# Patient Record
Sex: Female | Born: 1970
Health system: Southern US, Community
[De-identification: ages and names within clinical notes are randomized; demographics above are authoritative.]

## PROBLEM LIST (undated history)

## (undated) ENCOUNTER — Emergency Department (HOSPITAL_BASED_OUTPATIENT_CLINIC_OR_DEPARTMENT_OTHER): Admission: EM | Payer: No Typology Code available for payment source | Source: Home / Self Care

## (undated) DIAGNOSIS — Z800 Family history of malignant neoplasm of digestive organs: Secondary | ICD-10-CM

## (undated) DIAGNOSIS — F3289 Other specified depressive episodes: Secondary | ICD-10-CM

## (undated) DIAGNOSIS — F329 Major depressive disorder, single episode, unspecified: Secondary | ICD-10-CM

## (undated) DIAGNOSIS — D509 Iron deficiency anemia, unspecified: Secondary | ICD-10-CM

## (undated) DIAGNOSIS — T7840XA Allergy, unspecified, initial encounter: Secondary | ICD-10-CM

## (undated) DIAGNOSIS — J45909 Unspecified asthma, uncomplicated: Secondary | ICD-10-CM

## (undated) DIAGNOSIS — K219 Gastro-esophageal reflux disease without esophagitis: Secondary | ICD-10-CM

## (undated) DIAGNOSIS — F419 Anxiety disorder, unspecified: Secondary | ICD-10-CM

## (undated) HISTORY — DX: Other specified depressive episodes: F32.89

## (undated) HISTORY — DX: Iron deficiency anemia, unspecified: D50.9

## (undated) HISTORY — DX: Gastro-esophageal reflux disease without esophagitis: K21.9

## (undated) HISTORY — PX: TUBAL LIGATION: SHX77

## (undated) HISTORY — PX: BREAST CYST EXCISION: SHX579

## (undated) HISTORY — DX: Unspecified asthma, uncomplicated: J45.909

## (undated) HISTORY — PX: COLONOSCOPY: SHX174

## (undated) HISTORY — DX: Major depressive disorder, single episode, unspecified: F32.9

## (undated) HISTORY — DX: Allergy, unspecified, initial encounter: T78.40XA

## (undated) HISTORY — PX: OTHER SURGICAL HISTORY: SHX169

## (undated) HISTORY — DX: Family history of malignant neoplasm of digestive organs: Z80.0

---

## 1997-09-29 ENCOUNTER — Emergency Department (HOSPITAL_COMMUNITY): Admission: EM | Admit: 1997-09-29 | Discharge: 1997-09-29 | Payer: Self-pay | Admitting: Emergency Medicine

## 1998-08-16 ENCOUNTER — Emergency Department (HOSPITAL_COMMUNITY): Admission: EM | Admit: 1998-08-16 | Discharge: 1998-08-17 | Payer: Self-pay | Admitting: Emergency Medicine

## 1998-08-19 ENCOUNTER — Emergency Department (HOSPITAL_COMMUNITY): Admission: EM | Admit: 1998-08-19 | Discharge: 1998-08-19 | Payer: Self-pay | Admitting: Emergency Medicine

## 1998-11-22 ENCOUNTER — Emergency Department (HOSPITAL_COMMUNITY): Admission: EM | Admit: 1998-11-22 | Discharge: 1998-11-23 | Payer: Self-pay | Admitting: Emergency Medicine

## 1999-02-05 ENCOUNTER — Encounter: Payer: Self-pay | Admitting: Internal Medicine

## 1999-02-05 ENCOUNTER — Emergency Department (HOSPITAL_COMMUNITY): Admission: EM | Admit: 1999-02-05 | Discharge: 1999-02-05 | Payer: Self-pay | Admitting: Internal Medicine

## 1999-06-07 ENCOUNTER — Emergency Department (HOSPITAL_COMMUNITY): Admission: EM | Admit: 1999-06-07 | Discharge: 1999-06-07 | Payer: Self-pay | Admitting: *Deleted

## 1999-06-08 ENCOUNTER — Encounter: Payer: Self-pay | Admitting: *Deleted

## 1999-08-17 ENCOUNTER — Other Ambulatory Visit: Admission: RE | Admit: 1999-08-17 | Discharge: 1999-08-17 | Payer: Self-pay | Admitting: Obstetrics

## 1999-08-18 ENCOUNTER — Inpatient Hospital Stay (HOSPITAL_COMMUNITY): Admission: AD | Admit: 1999-08-18 | Discharge: 1999-08-18 | Payer: Self-pay | Admitting: Obstetrics

## 1999-10-11 ENCOUNTER — Inpatient Hospital Stay (HOSPITAL_COMMUNITY): Admission: AD | Admit: 1999-10-11 | Discharge: 1999-10-11 | Payer: Self-pay | Admitting: Obstetrics

## 1999-11-19 ENCOUNTER — Inpatient Hospital Stay (HOSPITAL_COMMUNITY): Admission: AD | Admit: 1999-11-19 | Discharge: 1999-11-19 | Payer: Self-pay | Admitting: Obstetrics

## 1999-12-05 ENCOUNTER — Inpatient Hospital Stay (HOSPITAL_COMMUNITY): Admission: AD | Admit: 1999-12-05 | Discharge: 1999-12-05 | Payer: Self-pay | Admitting: Obstetrics

## 2000-01-15 ENCOUNTER — Inpatient Hospital Stay (HOSPITAL_COMMUNITY): Admission: AD | Admit: 2000-01-15 | Discharge: 2000-01-15 | Payer: Self-pay | Admitting: Obstetrics

## 2000-02-17 ENCOUNTER — Inpatient Hospital Stay (HOSPITAL_COMMUNITY): Admission: AD | Admit: 2000-02-17 | Discharge: 2000-02-17 | Payer: Self-pay | Admitting: Obstetrics

## 2000-03-21 ENCOUNTER — Inpatient Hospital Stay (HOSPITAL_COMMUNITY): Admission: AD | Admit: 2000-03-21 | Discharge: 2000-03-23 | Payer: Self-pay | Admitting: Obstetrics

## 2003-08-16 ENCOUNTER — Emergency Department (HOSPITAL_COMMUNITY): Admission: EM | Admit: 2003-08-16 | Discharge: 2003-08-16 | Payer: Self-pay | Admitting: Family Medicine

## 2004-10-11 ENCOUNTER — Encounter: Admission: RE | Admit: 2004-10-11 | Discharge: 2004-10-11 | Payer: Self-pay | Admitting: Obstetrics

## 2004-10-18 ENCOUNTER — Ambulatory Visit (HOSPITAL_COMMUNITY): Admission: RE | Admit: 2004-10-18 | Discharge: 2004-10-18 | Payer: Self-pay | Admitting: Obstetrics

## 2004-10-25 ENCOUNTER — Inpatient Hospital Stay (HOSPITAL_COMMUNITY): Admission: AD | Admit: 2004-10-25 | Discharge: 2004-10-25 | Payer: Self-pay | Admitting: Obstetrics

## 2004-11-08 ENCOUNTER — Inpatient Hospital Stay (HOSPITAL_COMMUNITY): Admission: AD | Admit: 2004-11-08 | Discharge: 2004-11-08 | Payer: Self-pay | Admitting: Obstetrics

## 2005-05-06 ENCOUNTER — Inpatient Hospital Stay (HOSPITAL_COMMUNITY): Admission: AD | Admit: 2005-05-06 | Discharge: 2005-05-06 | Payer: Self-pay | Admitting: Obstetrics

## 2005-05-09 ENCOUNTER — Inpatient Hospital Stay (HOSPITAL_COMMUNITY): Admission: AD | Admit: 2005-05-09 | Discharge: 2005-05-09 | Payer: Self-pay | Admitting: Obstetrics

## 2005-07-05 ENCOUNTER — Emergency Department (HOSPITAL_COMMUNITY): Admission: EM | Admit: 2005-07-05 | Discharge: 2005-07-05 | Payer: Self-pay | Admitting: Emergency Medicine

## 2005-08-03 ENCOUNTER — Ambulatory Visit: Payer: Self-pay | Admitting: Internal Medicine

## 2006-04-30 ENCOUNTER — Emergency Department (HOSPITAL_COMMUNITY): Admission: EM | Admit: 2006-04-30 | Discharge: 2006-04-30 | Payer: Self-pay | Admitting: Family Medicine

## 2006-05-24 ENCOUNTER — Ambulatory Visit: Payer: Self-pay | Admitting: Internal Medicine

## 2007-02-26 ENCOUNTER — Encounter: Payer: Self-pay | Admitting: Internal Medicine

## 2007-02-26 DIAGNOSIS — D649 Anemia, unspecified: Secondary | ICD-10-CM

## 2007-07-28 ENCOUNTER — Ambulatory Visit: Payer: Self-pay | Admitting: Internal Medicine

## 2007-07-28 DIAGNOSIS — H6690 Otitis media, unspecified, unspecified ear: Secondary | ICD-10-CM | POA: Insufficient documentation

## 2007-07-28 DIAGNOSIS — F411 Generalized anxiety disorder: Secondary | ICD-10-CM

## 2007-07-28 DIAGNOSIS — D509 Iron deficiency anemia, unspecified: Secondary | ICD-10-CM

## 2007-11-05 ENCOUNTER — Telehealth: Payer: Self-pay | Admitting: Internal Medicine

## 2007-11-14 ENCOUNTER — Ambulatory Visit: Payer: Self-pay | Admitting: Internal Medicine

## 2008-02-04 ENCOUNTER — Ambulatory Visit: Payer: Self-pay | Admitting: Internal Medicine

## 2008-02-04 LAB — CONVERTED CEMR LAB
ALT: 21 units/L (ref 0–35)
Alkaline Phosphatase: 43 units/L (ref 39–117)
Basophils Absolute: 0 10*3/uL (ref 0.0–0.1)
Basophils Relative: 1 % (ref 0.0–3.0)
CO2: 27 meq/L (ref 19–32)
Calcium: 9.2 mg/dL (ref 8.4–10.5)
Creatinine, Ser: 0.8 mg/dL (ref 0.4–1.2)
Eosinophils Relative: 2.5 % (ref 0.0–5.0)
GFR calc Af Amer: 103 mL/min
GFR calc non Af Amer: 85 mL/min
HCT: 37.2 % (ref 36.0–46.0)
HDL: 75.8 mg/dL (ref >39.0)
LDL Cholesterol: 60 mg/dL (ref 0–99)
Lymphocytes Relative: 43.4 % (ref 12.0–46.0)
MCHC: 33.8 g/dL (ref 30.0–36.0)
Monocytes Relative: 5.8 % (ref 3.0–12.0)
Mucus, UA: NEGATIVE
Neutro Abs: 2 10*3/uL (ref 1.4–7.7)
Neutrophils Relative %: 47.3 % (ref 43.0–77.0)
Potassium: 3.7 meq/L (ref 3.5–5.1)
RBC: 4.18 M/uL (ref 3.87–5.11)
RDW: 12.8 % (ref 11.5–14.6)
Sodium: 139 meq/L (ref 135–145)
Specific Gravity, Urine: 1.02 (ref 1.000–1.03)
TSH: 1.08 microintl units/mL (ref 0.35–5.50)
VLDL: 14 mg/dL (ref 0–40)
pH: 6.5 (ref 5.0–8.0)

## 2008-02-05 ENCOUNTER — Telehealth: Payer: Self-pay | Admitting: Internal Medicine

## 2008-02-17 ENCOUNTER — Ambulatory Visit: Payer: Self-pay | Admitting: Internal Medicine

## 2008-02-17 DIAGNOSIS — F32A Depression, unspecified: Secondary | ICD-10-CM | POA: Insufficient documentation

## 2008-02-17 DIAGNOSIS — F329 Major depressive disorder, single episode, unspecified: Secondary | ICD-10-CM

## 2008-02-17 DIAGNOSIS — R634 Abnormal weight loss: Secondary | ICD-10-CM | POA: Insufficient documentation

## 2008-02-19 ENCOUNTER — Ambulatory Visit: Payer: Self-pay | Admitting: Gastroenterology

## 2008-02-20 ENCOUNTER — Telehealth: Payer: Self-pay | Admitting: Internal Medicine

## 2008-02-25 ENCOUNTER — Ambulatory Visit: Payer: Self-pay | Admitting: Gastroenterology

## 2008-02-26 ENCOUNTER — Telehealth: Payer: Self-pay | Admitting: Gastroenterology

## 2008-02-29 DIAGNOSIS — R1013 Epigastric pain: Secondary | ICD-10-CM

## 2008-03-01 ENCOUNTER — Telehealth (INDEPENDENT_AMBULATORY_CARE_PROVIDER_SITE_OTHER): Payer: Self-pay | Admitting: *Deleted

## 2008-03-01 ENCOUNTER — Ambulatory Visit: Payer: Self-pay | Admitting: Gastroenterology

## 2008-03-02 ENCOUNTER — Telehealth: Payer: Self-pay | Admitting: Gastroenterology

## 2008-03-02 LAB — CONVERTED CEMR LAB
ALT: 23 units/L (ref 0–35)
AST: 21 units/L (ref 0–37)
Albumin: 3.9 g/dL (ref 3.5–5.2)
Alkaline Phosphatase: 38 units/L — ABNORMAL LOW (ref 39–117)
Basophils Relative: 0.5 % (ref 0.0–3.0)
CO2: 28 meq/L (ref 19–32)
Chloride: 107 meq/L (ref 96–112)
Eosinophils Relative: 2 % (ref 0.0–5.0)
GFR calc non Af Amer: 118 mL/min
Hemoglobin: 11.6 g/dL — ABNORMAL LOW (ref 12.0–15.0)
Lymphocytes Relative: 41.3 % (ref 12.0–46.0)
MCHC: 34.2 g/dL (ref 30.0–36.0)
MCV: 86.3 fL (ref 78.0–100.0)
Platelets: 281 10*3/uL (ref 150–400)
Potassium: 3.4 meq/L — ABNORMAL LOW (ref 3.5–5.1)

## 2008-07-23 ENCOUNTER — Telehealth: Payer: Self-pay | Admitting: Internal Medicine

## 2008-08-31 ENCOUNTER — Telehealth (INDEPENDENT_AMBULATORY_CARE_PROVIDER_SITE_OTHER): Payer: Self-pay | Admitting: *Deleted

## 2008-10-04 ENCOUNTER — Ambulatory Visit: Payer: Self-pay | Admitting: Internal Medicine

## 2008-10-04 DIAGNOSIS — R05 Cough: Secondary | ICD-10-CM

## 2008-10-07 ENCOUNTER — Telehealth: Payer: Self-pay | Admitting: Internal Medicine

## 2008-10-25 ENCOUNTER — Telehealth: Payer: Self-pay | Admitting: Internal Medicine

## 2009-02-17 ENCOUNTER — Encounter: Payer: Self-pay | Admitting: Internal Medicine

## 2009-02-18 ENCOUNTER — Ambulatory Visit: Payer: Self-pay | Admitting: Internal Medicine

## 2009-02-18 DIAGNOSIS — M79609 Pain in unspecified limb: Secondary | ICD-10-CM

## 2009-02-18 DIAGNOSIS — R109 Unspecified abdominal pain: Secondary | ICD-10-CM | POA: Insufficient documentation

## 2009-02-18 DIAGNOSIS — N63 Unspecified lump in unspecified breast: Secondary | ICD-10-CM

## 2009-02-18 DIAGNOSIS — R079 Chest pain, unspecified: Secondary | ICD-10-CM | POA: Insufficient documentation

## 2009-02-18 LAB — CONVERTED CEMR LAB
Albumin: 4.3 g/dL (ref 3.5–5.2)
Alkaline Phosphatase: 43 units/L (ref 39–117)
Basophils Relative: 0.9 % (ref 0.0–3.0)
CO2: 29 meq/L (ref 19–32)
Calcium: 9.4 mg/dL (ref 8.4–10.5)
Eosinophils Absolute: 0.1 10*3/uL (ref 0.0–0.7)
Eosinophils Relative: 2.4 % (ref 0.0–5.0)
GFR calc non Af Amer: 119.67 mL/min (ref >60)
Glucose, Bld: 87 mg/dL (ref 70–99)
Hemoglobin: 12.8 g/dL (ref 12.0–15.0)
Lipase: 12 units/L (ref 11.0–59.0)
Lymphs Abs: 2 10*3/uL (ref 0.7–4.0)
MCV: 88 fL (ref 78.0–100.0)
Monocytes Absolute: 0.3 10*3/uL (ref 0.1–1.0)
Neutrophils Relative %: 47 % (ref 43.0–77.0)
Potassium: 4.1 meq/L (ref 3.5–5.1)
Sodium: 137 meq/L (ref 135–145)
Total Protein, Urine: NEGATIVE mg/dL
Urobilinogen, UA: 0.2 (ref 0.0–1.0)
pH: 7 (ref 5.0–8.0)

## 2009-02-21 ENCOUNTER — Telehealth: Payer: Self-pay | Admitting: Internal Medicine

## 2009-03-03 ENCOUNTER — Encounter: Admission: RE | Admit: 2009-03-03 | Discharge: 2009-03-03 | Payer: Self-pay | Admitting: Internal Medicine

## 2009-05-24 ENCOUNTER — Telehealth: Payer: Self-pay | Admitting: Internal Medicine

## 2009-07-04 LAB — HM MAMMOGRAPHY

## 2009-08-09 ENCOUNTER — Ambulatory Visit: Payer: Self-pay | Admitting: Internal Medicine

## 2009-09-06 ENCOUNTER — Encounter: Payer: Self-pay | Admitting: Emergency Medicine

## 2009-09-07 ENCOUNTER — Ambulatory Visit: Payer: Self-pay | Admitting: Internal Medicine

## 2009-09-07 ENCOUNTER — Encounter: Payer: Self-pay | Admitting: Internal Medicine

## 2009-09-07 DIAGNOSIS — M5412 Radiculopathy, cervical region: Secondary | ICD-10-CM | POA: Insufficient documentation

## 2009-09-08 ENCOUNTER — Telehealth: Payer: Self-pay | Admitting: Internal Medicine

## 2009-09-09 ENCOUNTER — Ambulatory Visit (HOSPITAL_COMMUNITY): Admission: RE | Admit: 2009-09-09 | Discharge: 2009-09-09 | Payer: Self-pay | Admitting: Internal Medicine

## 2009-10-06 ENCOUNTER — Ambulatory Visit: Payer: Self-pay | Admitting: Psychology

## 2009-11-14 ENCOUNTER — Telehealth: Payer: Self-pay | Admitting: Internal Medicine

## 2010-01-26 ENCOUNTER — Ambulatory Visit: Payer: Self-pay | Admitting: Internal Medicine

## 2010-01-26 DIAGNOSIS — J019 Acute sinusitis, unspecified: Secondary | ICD-10-CM

## 2010-01-26 DIAGNOSIS — R062 Wheezing: Secondary | ICD-10-CM

## 2010-03-01 ENCOUNTER — Encounter: Payer: Self-pay | Admitting: Internal Medicine

## 2010-03-20 ENCOUNTER — Ambulatory Visit: Payer: Self-pay | Admitting: Internal Medicine

## 2010-03-20 ENCOUNTER — Telehealth: Payer: Self-pay | Admitting: Internal Medicine

## 2010-03-21 ENCOUNTER — Encounter: Admission: RE | Admit: 2010-03-21 | Discharge: 2010-03-21 | Payer: Self-pay | Admitting: Obstetrics

## 2010-03-21 ENCOUNTER — Ambulatory Visit (HOSPITAL_COMMUNITY): Admission: RE | Admit: 2010-03-21 | Discharge: 2010-03-21 | Payer: Self-pay | Admitting: Obstetrics

## 2010-03-22 LAB — CONVERTED CEMR LAB
Basophils Relative: 0.9 % (ref 0.0–3.0)
Folate: 10.1 ng/mL
HCT: 34.6 % — ABNORMAL LOW (ref 36.0–46.0)
Iron: 58 ug/dL (ref 42–145)
Lymphs Abs: 3 10*3/uL (ref 0.7–4.0)
MCHC: 34.1 g/dL (ref 30.0–36.0)
Neutrophils Relative %: 39.3 % — ABNORMAL LOW (ref 43.0–77.0)
RDW: 14.6 % (ref 11.5–14.6)
Saturation Ratios: 11.8 % — ABNORMAL LOW (ref 20.0–50.0)

## 2010-03-29 ENCOUNTER — Ambulatory Visit: Payer: Self-pay | Admitting: Internal Medicine

## 2010-03-29 ENCOUNTER — Encounter: Payer: Self-pay | Admitting: Internal Medicine

## 2010-03-29 DIAGNOSIS — R040 Epistaxis: Secondary | ICD-10-CM | POA: Insufficient documentation

## 2010-03-29 DIAGNOSIS — L049 Acute lymphadenitis, unspecified: Secondary | ICD-10-CM | POA: Insufficient documentation

## 2010-03-29 LAB — CONVERTED CEMR LAB
Basophils Absolute: 0 10*3/uL (ref 0.0–0.1)
Eosinophils Relative: 2.4 % (ref 0.0–5.0)
HCT: 33.8 % — ABNORMAL LOW (ref 36.0–46.0)
Hemoglobin: 11.2 g/dL — ABNORMAL LOW (ref 12.0–15.0)
INR: 1.1 — ABNORMAL HIGH (ref 0.8–1.0)
Lymphs Abs: 1.5 10*3/uL (ref 0.7–4.0)
MCV: 83.1 fL (ref 78.0–100.0)
Monocytes Absolute: 0.3 10*3/uL (ref 0.1–1.0)
Neutro Abs: 1.4 10*3/uL (ref 1.4–7.7)
Neutrophils Relative %: 40.8 % — ABNORMAL LOW (ref 43.0–77.0)
WBC: 3.3 10*3/uL — ABNORMAL LOW (ref 4.5–10.5)

## 2010-04-17 ENCOUNTER — Ambulatory Visit (HOSPITAL_BASED_OUTPATIENT_CLINIC_OR_DEPARTMENT_OTHER): Admission: RE | Admit: 2010-04-17 | Discharge: 2010-04-17 | Payer: Self-pay | Admitting: Surgery

## 2010-05-26 ENCOUNTER — Encounter: Payer: Self-pay | Admitting: Internal Medicine

## 2010-06-11 ENCOUNTER — Encounter: Payer: Self-pay | Admitting: Internal Medicine

## 2010-06-20 NOTE — Assessment & Plan Note (Signed)
Summary: SORE THROAT/ CHEST HURTS/ SICK SINCE MONDAY/NWS   Vital Signs:  Patient profile:   40 year old female Height:      64 inches Weight:      106.50 pounds BMI:     18.35 O2 Sat:      99 % on Room air Temp:     98.4 degrees F oral Pulse rate:   84 / minute BP sitting:   100 / 78  (left arm) Cuff size:   regular  Vitals Entered By: Zella Ball Ewing CMA (AAMA) (January 26, 2010 4:05 PM)  O2 Flow:  Room air CC: Sore throat, congestion in chest and head/RE   Primary Care Provider:  Corwin Levins MD  CC:  Sore throat and congestion in chest and head/RE.  History of Present Illness: here with acute onset mild to mod 2-3 days facial pain, pressure, fever and greenish d/c, mild ST and non prod cough, headache  and midl wheezing/sob since last PM.  Hard to take deep breath , and was exposed to sick neice recently.  Also denies worsening depressive symptoms, has ongoing stressors at work and social,  denies panic, Pt denies new neuro symptoms such as facial or extremity weakness . No wt loss, night sweats, loss of appetite or other constitutional symptoms   Problems Prior to Update: 1)  Wheezing  (ICD-786.07) 2)  Sinusitis- Acute-nos  (ICD-461.9) 3)  Arm Pain, Right  (ICD-729.5) 4)  Cervical Radiculopathy, Right  (ICD-723.4) 5)  Abdominal Pain, Unspecified Site  (ICD-789.00) 6)  Breast Mass, Left  (ICD-611.72) 7)  Chest Pain  (ICD-786.50) 8)  Arm Pain, Right  (ICD-729.5) 9)  Cough  (ICD-786.2) 10)  Epigastric Pain  (ICD-789.06) 11)  Weight Loss  (ICD-783.21) 12)  Depression  (ICD-311) 13)  Preventive Health Care  (ICD-V70.0) 14)  Family History of Colon Ca 1st Degree Relative <60  (ICD-V16.0) 15)  Anxiety  (ICD-300.00) 16)  Otitis Media, Acute, Bilateral  (ICD-382.9) 17)  Anemia-iron Deficiency  (ICD-280.9) 18)  Anemia Nos  (ICD-285.9)  Medications Prior to Update: 1)  Alprazolam 0.25 Mg  Tabs (Alprazolam) .Marland Kitchen.. 1 By Mouth Two Times A Day As Needed 2)  Meloxicam 7.5 Mg Tabs  (Meloxicam) .Marland Kitchen.. 1 By Mouth Once Daily X 7 Days, Then As Needed For Pain 3)  Flexeril 5 Mg Tabs (Cyclobenzaprine Hcl) .Marland Kitchen.. 1 By Mouth Three Times A Day As Needed 4)  Oxycodone Hcl 5 Mg Caps (Oxycodone Hcl) .Marland Kitchen.. 1 - 2 By Mouth Q 6 Hrs As Needed 5)  Prednisone 10 Mg Tabs (Prednisone) .... 3po Qd For 3days, Then 2po Qd For 3days, Then 1po Qd For 3days, Then Stop  Current Medications (verified): 1)  Alprazolam 0.25 Mg  Tabs (Alprazolam) .Marland Kitchen.. 1 By Mouth Two Times A Day As Needed 2)  Prednisone 10 Mg Tabs (Prednisone) .... 3po Qd For 3days, Then 2po Qd For 3days, Then 1po Qd For 3days, Then Stop 3)  Azithromycin 250 Mg Tabs (Azithromycin) .... 2po Qd For 1 Day, Then 1po Qd For 4days, Then Stop 4)  Hydrocodone-Homatropine 5-1.5 Mg Tabs (Hydrocodone-Homatropine) .Marland Kitchen.. 1 Tsp By Mouth Q 6 Hrs As Needed Cough  Allergies (verified): 1)  ! * Avelox 2)  ! 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  , mild ill  Head:  normocephalic and atraumatic.   Eyes:  vision grossly intact, pupils equal, and pupils round.   Ears:  bilat tm's red, sinus tender bilat Nose:  nasal dischargemucosal pallor and mucosal edema.   Mouth:  pharyngeal erythema and fair dentition.   Neck:  supple and cervical lymphadenopathy.   Lungs:  normal respiratory effort, R decreased breath sounds, R wheezes, L decreased breath sounds, and L wheezes.   - overall mild Heart:  normal rate and regular  rhythm.   Extremities:  no edema, no erythema    Impression & Recommendations:  Problem # 1:  SINUSITIS- ACUTE-NOS (ICD-461.9)  Her updated medication list for this problem includes:    Azithromycin 250 Mg Tabs (Azithromycin) .Marland Kitchen... 2po qd for 1 day, then 1po qd for 4days, then stop    Hydrocodone-homatropine 5-1.5 Mg Tabs (Hydrocodone-homatropine) .Marland Kitchen... 1 tsp by mouth q 6 hrs as needed cough treat as above, f/u any worsening signs or symptoms   Problem # 2:  WHEEZING (ICD-786.07) with cough - for cough med, and pred pack by mouth   Problem # 3:  ANXIETY (ICD-300.00)  Her updated medication list for this problem includes:    Alprazolam 0.25 Mg Tabs (Alprazolam) .Marland Kitchen... 1 by mouth two times a day as needed stable overall by hx and exam, ok to continue meds/tx as is   Complete Medication List: 1)  Alprazolam 0.25 Mg Tabs (Alprazolam) .Marland Kitchen.. 1 by mouth two times a day as needed 2)  Prednisone 10 Mg Tabs (Prednisone) .... 3po qd for 3days, then 2po qd for 3days, then 1po qd for 3days, then stop 3)  Azithromycin 250 Mg Tabs (Azithromycin) .... 2po qd for 1 day, then 1po qd for 4days, then stop 4)  Hydrocodone-homatropine 5-1.5 Mg Tabs (Hydrocodone-homatropine) .Marland Kitchen.. 1 tsp by mouth q 6 hrs as needed cough  Patient Instructions: 1)  Please take all new medications as prescribed 2)  Continue all previous medications as before this visit  3)  Please schedule a follow-up appointment as needed. Prescriptions: HYDROCODONE-HOMATROPINE 5-1.5 MG TABS (HYDROCODONE-HOMATROPINE) 1 tsp by mouth q 6 hrs as needed cough  #6oz x 1   Entered and Authorized by:   Corwin Levins MD   Signed by:   Corwin Levins MD on 01/26/2010   Method used:   Print then Give to Patient   RxID:   [id number] AZITHROMYCIN 250 MG TABS (AZITHROMYCIN) 2po qd for 1 day, then 1po qd for 4days, then stop  #6 x 1   Entered and Authorized by:   Corwin Levins MD   Signed by:   Corwin Levins MD on 01/26/2010   Method used:   Print  then Give to Patient   RxID:   [id number] PREDNISONE 10 MG TABS (PREDNISONE) 3po qd for 3days, then 2po qd for 3days, then 1po qd for 3days, then stop  #18 x 0   Entered and Authorized by:   Corwin Levins MD   Signed by:   Corwin Levins MD on 01/26/2010   Method used:   Print then Give to Patient   RxID:   [id number]

## 2010-06-20 NOTE — Assessment & Plan Note (Signed)
Summary: CHEST PRESSURE SINCE "SUNDAY/ DULL ACHE ON RIGHT SIDE/ CAN'T S...   Vital Signs:  Patient profile:   40 year old female Height:      64 inches (162.56 cm) Weight:      109.2 pounds (49.64 kg) O2 Sat:      98 % on Room air Temp:     97" .7 degrees F (36.50 degrees C) oral Pulse rate:   82 / minute BP sitting:   126 / 82  (left arm) Cuff size:   regular  Vitals Entered By: Orlan Leavens (August 09, 2009 4:07 PM)  O2 Flow:  Room air CC: chest discomfort since Sat. Pt states above (R) side breast area. (R) arm tingley/numbness Is Patient Diabetic? No Pain Assessment Patient in pain? yes     Location: chest Type: dull/sharp sometime   Primary Care Provider:  Corwin Levins MD  CC:  chest discomfort since Sat. Pt states above (R) side breast area. (R) arm tingley/numbness.  History of Present Illness:  Oct 2010 note by PCP reviewed - here with intermittent right side dll aching of the chest , more mild to mod, some aching to the right neck and arm with pain and tingling, as well as pain to the right mid and upper back - all at the same time as the right chest pain; also with  with headache off an on for 2 day;  no RUE weakness or lloss of grip strength;  no fever, ST, cough, sob, doe, orhtopnea, pnd or worsening LE edema;  Also had mid to upper abd pain, crampy and bloating, hurts more to eat when she finishes (although sometimes before), no n/v, also with intermittent diarrhea  for 2 day - loose and watery  - bad for 2 days, now better; feels warm at night, not sure about fever, having hot flashes recurrent for months;  due to see GYN (will make appt) for ? lump to the left chest  under the breast;  wt overall stable up and down; has ongoing marked stress at work and home as well        This is a 39 year old female who presents with Chest pain.  The symptoms began 4 days ago; hx same 02/2009 ago.  On a scale of 1 to 10, the intensity is described as a 3-4.  no known CAD.  The patient  reports resting chest pain, but denies nausea, vomiting, diaphoresis, shortness of breath, palpitations, dizziness, syncope, and indigestion.  The pain is described as intermittent and dull.  The pain is located in the right anterior chest, neck, and right upper chest.  The pain radiates to the right arm.  Episodes of chest pain last >30 minutes.  The pain is brought on or made worse by lying down.  The pain is relieved or improved with NSAIDs, especially Aleve, and change in position.  Continued + family stress with sister's illness and sister's 3 children.  Clinical Review Panels:  Lipid Management   Cholesterol:  150 (02/04/2008)   LDL (bad choesterol):  60 (02/04/2008)   HDL (good cholesterol):  75.8 (02/04/2008)  CBC   WBC:  4.7 (02/18/2009)   RBC:  4.28 (02/18/2009)   Hgb:  12.8 (02/18/2009)   Hct:  37.7 (02/18/2009)   Platelets:  278.0 (02/18/2009)   MCV  88.0 (02/18/2009)   MCHC  33.9 (02/18/2009)   RDW  12.9 (02/18/2009)   PMN:  47.0 (02/18/2009)   Lymphs:  43.2 (02/18/2009)   Monos:  6.5 (02/18/2009)   Eosinophils:  2.4 (02/18/2009)   Basophil:  0.9 (02/18/2009)  Complete Metabolic Panel   Glucose:  87 (02/18/2009)   Sodium:  137 (02/18/2009)   Potassium:  4.1 (02/18/2009)   Chloride:  104 (02/18/2009)   CO2:  29 (02/18/2009)   BUN:  11 (02/18/2009)   Creatinine:  0.7 (02/18/2009)   Albumin:  4.3 (02/18/2009)   Total Protein:  7.8 (02/18/2009)   Calcium:  9.4 (02/18/2009)   Total Bili:  1.5 (02/18/2009)   Alk Phos:  43 (02/18/2009)   SGPT (ALT):  15 (02/18/2009)   SGOT (AST):  18 (02/18/2009)   Current Medications (verified): 1)  Alprazolam 0.25 Mg  Tabs (Alprazolam) .Marland Kitchen.. 1 By Mouth Two Times A Day As Needed  Allergies (verified): 1)  ! * Avelox 2)  ! Asa  Past History:  Past Medical History: Anemia-iron deficiency chronic insomnia Anxiety Depression  Review of Systems  The patient denies fever, hoarseness, peripheral edema, headaches, abdominal  pain, and severe indigestion/heartburn.    Physical Exam  General:  alert and well-developed but lean appearing Lungs:  normal respiratory effort, no intercostal retractions or use of accessory muscles; normal breath sounds bilaterally - no crackles and no wheezes.    Heart:  normal rate, regular rhythm, no murmur, and no rub. BLE without edema.  Neurologic:  alert & oriented X3 and cranial nerves II-XII symetrically intact.  strength normal in all extremities, sensation intact to light touch, and gait normal. speech fluent without dysarthria or aphasia; follows commands with good comprehension. negative Hoffman's   Impression & Recommendations:  Problem # 1:  CHEST PAIN (ICD-786.50) EKG ok - ?radicular from neck - see next - as improved transiently with aleve, try longer acting Mobic for pain - also muscle relaxant to help with sleep at night - reassurance provided re: normal EKG and VS and exam Orders: EKG w/ Interpretation (93000) T-Cervicle Spine 2-3 Views ([id number]) Prescription Created Electronically [id number])  Problem # 2:  ARM PAIN, RIGHT (ICD-729.5) hx same 02/2009 - improved with position and NSAIDs - worse at night check cervical xray to look for DDD - ?need MRI c-spine as discussed in Oct 2010 - no motor deficits on exam - depending on xray and response to tx, consider need for MRI Orders: T-Cervicle Spine 2-3 Views [id number]) Prescription Created Electronically [id number])  Problem # 3:  ANXIETY (ICD-300.00)  Her updated medication list for this problem includes:    Alprazolam 0.25 Mg Tabs (Alprazolam) .Marland Kitchen... 1 by mouth two times a day as needed  Complete Medication List: 1)  Alprazolam 0.25 Mg Tabs (Alprazolam) .Marland Kitchen.. 1 by mouth two times a day as needed 2)  Meloxicam 7.5 Mg Tabs (Meloxicam) .Marland Kitchen.. 1 by mouth once daily x 7 days, then as needed for pain 3)  Flexeril 5 Mg Tabs (Cyclobenzaprine hcl) .Marland Kitchen.. 1 by mouth at bedtime as needed for spasm pain  Patient  Instructions: 1)  it was good to see you today. 2)  your EKG and exam look good - no heart or lung problems detected 3)  xray today to look for neck arthritis - we will notify you directly about these results 4)  treat pain with 7 day trial meloxicam for pain and muscle relaxant for spasm at bedtime - your prescriptions have been electronically submitted to your pharmacy. Please take as directed. Contact our office if you believe you're having problems with the medication(s).  5)  Please schedule a follow-up appointment as needed. Prescriptions: FLEXERIL 5  MG TABS (CYCLOBENZAPRINE HCL) 1 by mouth at bedtime as needed for spasm pain  #30 x 0   Entered and Authorized by:   Newt Lukes MD   Signed by:   Newt Lukes MD on 08/09/2009   Method used:   Electronically to        [facility] [address] [phone]* (retail)       [address]       [place], [place]  [address]       Ph: [phone] or [phone]       Fax: [phone]   RxID:   [id number] MELOXICAM 7.5 MG TABS (MELOXICAM) 1 by mouth once daily x 7 days, then as needed for pain  #30 x 0   Entered and Authorized by:   Newt Lukes MD   Signed by:   Newt Lukes MD on 08/09/2009   Method used:   [facility]        [facility] [address] [phone]* (retail)       [address]       [place], [place]  [address]       Ph: [phone] or [phone]       Fax: [phone]   RxID:   [id number]

## 2010-06-20 NOTE — Progress Notes (Signed)
Summary: pt request  Phone Note Call from Patient   Caller: [phone] Summary of Call: pt is requesting something to help with her nreves. He sister is dying of cancer and pt is very anxious. pt [facility] requestinf [facility] Initial call taken by: Margaret Pyle, CMA,  May 24, 2009 11:09 AM  Follow-up for Phone Call        done hardcopy to [facility] side B - dahlia  Follow-up by: Corwin Levins MD,  May 24, 2009 1:08 PM  Additional Follow-up for Phone Call Additional follow up Details #1::        faxed to pharmacy per pt request Additional Follow-up by: Margaret Pyle, CMA,  May 24, 2009 1:46 PM    Prescriptions: ALPRAZOLAM 0.25 MG  TABS (ALPRAZOLAM) 1 by mouth two times a day as needed  #60 x 2   Entered and Authorized by:   Corwin Levins MD   Signed by:   Corwin Levins MD on 05/24/2009   Method used:   Print then Give to Patient   RxID:   [id number]

## 2010-06-20 NOTE — Progress Notes (Signed)
Summary: Call Report  Phone Note Other Incoming   Caller: [facility] Call Report Summary of Call: Triage Call Report Triage Record Num: [id number] Operator: Aundra Millet Patient Name: Ashley Christensen Call Date & Time: 09/07/2009 11:15:03PM Patient Phone: [phone] PCP: Oliver Barre Patient Gender: Female PCP Fax : [phone] Patient DOB: 11/29/1971 Practice Name: [facility] Reason for Call: Pt had OV with Dr Jonny Ruiz for arm pain that resulted from a fall 09/06/2009. Pt had negative xray. Pt was prescribed Prednisone, Flexeril and Oxycodone. Pt took Oxycodone at 2230, and then 30 mins ago vomited 4-5 times and feeling dizzy to the point cant stand and feels like she is going to pass out. Also having shaking. A friend is with her. RN advised to dial 911 and she agreed. Protocol(s) Used: Nausea / Vomiting Recommended Outcome per Protocol: Activate EMS 911 Reason for Outcome: New or worsening signs and symptoms that may indicate shock Care Advice:  ~ Do not give the patient anything to eat or drink. Write down provider's name. List or place the following in a bag for transport with the patient: current prescription and/or OTC medications; alternative treatments, therapies and medications; and street drugs.  ~  ~ An adult should stay with the patient, preferably one trained in CPR.  ~ If vomiting occurs or appears likely, turn on side to prevent aspiration. Lay the person down and elevate legs at least 12 inches (30 cm) above level of heart. Cover to help maintain body temperature.  ~  ~ IMMEDIATE ACTION 04/ Initial call taken by: Margaret Pyle, CMA,  September 08, 2009 11:59 AM  Follow-up for Phone Call        stop the oxycodone;  have pt call if any further symtpoms Follow-up by: Corwin Levins MD,  September 08, 2009 12:34 PM  Additional Follow-up for Phone Call Additional follow up Details #1::        pt informed Additional Follow-up by: Margaret Pyle, CMA,  September 08, 2009 1:15 PM

## 2010-06-20 NOTE — Assessment & Plan Note (Signed)
Summary: ER FU / FELL/ INJURED ARM/NWS   Vital Signs:  Patient profile:   40 year old female Height:      64 inches Weight:      109.25 pounds BMI:     18.82 O2 Sat:      98 % on Room air Temp:     97.7 degrees F oral Pulse rate:   78 / minute BP sitting:   112 / 80  (left arm) Cuff size:   regular  Vitals Entered ByZella Ball Ewing (September 07, 2009 2:11 PM)  O2 Flow:  Room air CC: Fell hurt Right Arm, swollen, numbness/RE   Primary Care Provider:  Corwin Levins MD  CC:  Larey Seat hurt Right Arm, swollen, and numbness/RE.  History of Present Illness: here for f/u after being seen in the [facility] ER yesterday (for some reason , we cannot obtain ANY records such as MD documentation, or radiolog reports on the [place]).  States she has baseline right neck pain with ? right radicular symptoms intermittent but mild pain only and was getting better.  She fell the last 3 stairs of four flights as she was descending to answer the buzzer to open the front door of the secure building (turned out to be a nephew just having fun with buzzing and then not anwering).  States majority of pain is right neck, upper back and right elbow area, though the whole right neck and shoulder and arm hurt quite a bit.  Was 10/10 last evening and tx iwth limited narcotic rx and naproxen per pt (neither of which she had filled yet).  Pain to the right elbow still 8/10, with marked soft tissue swelling and tenderness, but actually also worse to the right neck with radiculalr pain it seems to below the elbow and assoc with numbness and weakness that seemed to start this morning. No bowel or bladder changes, and no LE symtpoms of pain, weak, numb, or further falls or injury.  no fever, night sweats; ongoing anxiety overall stable.   She is right handed.    Problems Prior to Update: 1)  Arm Pain, Right  (ICD-729.5) 2)  Cervical Radiculopathy, Right  (ICD-723.4) 3)  Abdominal Pain, Unspecified Site  (ICD-789.00) 4)  Breast Mass, Left   (ICD-611.72) 5)  Chest Pain  (ICD-786.50) 6)  Arm Pain, Right  (ICD-729.5) 7)  Cough  (ICD-786.2) 8)  Epigastric Pain  (ICD-789.06) 9)  Weight Loss  (ICD-783.21) 10)  Depression  (ICD-311) 11)  Preventive Health Care  (ICD-V70.0) 12)  Family History of Colon Ca 1st Degree Relative <60  (ICD-V16.0) 13)  Anxiety  (ICD-300.00) 14)  Otitis Media, Acute, Bilateral  (ICD-382.9) 15)  Anemia-iron Deficiency  (ICD-280.9) 16)  Anemia Nos  (ICD-285.9)  Medications Prior to Update: 1)  Alprazolam 0.25 Mg  Tabs (Alprazolam) .Marland Kitchen.. 1 By Mouth Two Times A Day As Needed 2)  Meloxicam 7.5 Mg Tabs (Meloxicam) .Marland Kitchen.. 1 By Mouth Once Daily X 7 Days, Then As Needed For Pain 3)  Flexeril 5 Mg Tabs (Cyclobenzaprine Hcl) .Marland Kitchen.. 1 By Mouth At Bedtime As Needed For Spasm Pain  Current Medications (verified): 1)  Alprazolam 0.25 Mg  Tabs (Alprazolam) .Marland Kitchen.. 1 By Mouth Two Times A Day As Needed 2)  Meloxicam 7.5 Mg Tabs (Meloxicam) .Marland Kitchen.. 1 By Mouth Once Daily X 7 Days, Then As Needed For Pain 3)  Flexeril 5 Mg Tabs (Cyclobenzaprine Hcl) .Marland Kitchen.. 1 By Mouth Three Times A Day As Needed 4)  Oxycodone Hcl 5  Mg Caps (Oxycodone Hcl) .Marland Kitchen.. 1 - 2 By Mouth Q 6 Hrs As Needed 5)  Prednisone 10 Mg Tabs (Prednisone) .... 3po Qd For 3days, Then 2po Qd For 3days, Then 1po Qd For 3days, Then Stop  Allergies (verified): 1)  ! * Avelox 2)  ! 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   Head:  normocephalic and atraumatic.   Eyes:  vision grossly intact, pupils equal, and pupils round.   Ears:  R ear normal and L ear normal.   Nose:  no external deformity and no nasal discharge.   Mouth:  no gingival abnormalities and pharynx pink and moist.   Neck:  supple and no masses.   Lungs:  normal respiratory effort and normal breath sounds.   Heart:  normal rate and regular rhythm.   Msk:  spine nontender througout except lower levels mild c-spine tender;  has moderate tender to right trapezoid and  paracervical area  - wtih pain on turning head right horizontal plane with slight decreased ROM;; right elbow with severe soft tissue swelling at the lateral epidondylar area without bruising or obvious bony abnormality, and has FROM Extremities:  no edema, no erythema  Neurologic:  alert & oriented X3, cranial nerves II-XII intact, strength normal in all extremities, sensation intact to light touch, and DTRs symmetrical and normal., except for decreased sensation to LT to c4 and c5 dermatomes, and possible decreased right grip strength alhtough difficult to assess due to pain Skin:  color normal and no rashes.   Psych:  slightly anxious.     Impression & Recommendations:  Problem # 1:  CERVICAL RADICULOPATHY, RIGHT (ICD-723.4)  vs severe strain of RUE and shoulder after fall - seems to have abnormal exam as above -   treat as above, (toradol 30 mg in the office, oxycodone as needed for home, flexeril as needed, and predpack course);  also for MRI c-spine, consider ortho/NS referral;  also off work note given  Orders: Radiology Referral (Radiology)  Problem # 2:  ARM PAIN, RIGHT (ICD-729.5)  severe MSK contusion noted on exam, cant r/o underlying  fx,  and NO ER records available on echart (MD record or Radiolog report);  will check right elbow film due to marked local swelling  Orders: Ketorolac-Toradol 15mg  (G9562) Admin of Therapeutic Inj  intramuscular or subcutaneous (13086)   addendum:   we did find xray report under pt 's misspelled name:  Ashley Christensen - right elbow, arm and wrist neg for fx  - will hold on doing the film today  Problem # 3:  ANXIETY (ICD-300.00)  Her updated medication list for this problem includes:    Alprazolam 0.25 Mg Tabs (Alprazolam) .Marland Kitchen... 1 by mouth two times a day as needed  stable overall by hx and exam, ok to continue meds/tx as is   Complete Medication List: 1)  Alprazolam 0.25 Mg Tabs (Alprazolam) .Marland Kitchen.. 1 by mouth two times a day as needed 2)  Meloxicam 7.5 Mg Tabs (Meloxicam) .Marland Kitchen.. 1 by mouth once daily x 7 days, then as needed for pain 3)  Flexeril 5 Mg Tabs (Cyclobenzaprine hcl) .Marland Kitchen.. 1 by mouth three times a day as needed 4)  Oxycodone Hcl 5 Mg Caps (Oxycodone hcl) .Marland Kitchen.. 1 - 2 by mouth q 6 hrs as needed 5)  Prednisone 10 Mg Tabs (Prednisone) .... 3po qd for 3days, then 2po qd for 3days, then 1po qd for 3days, then stop  Patient Instructions: 1)  You were given the pain shot in the office (toradol) 2)  Please take all new medications as prescribed  - the pain medicine, muscle relaxer, and prednisone 3)  please do not take the meloxicam on the days you take the prednisone 4)  Please go to Radiology in the basement level for your X-Ray today  5)  You will be contacted about the referral(s) to: MRI for the neck (and depending on results, you may need Neurosurgury evaluation) 6)  You are given the work note today 7)  Please schedule a follow-up appointment as needed. Prescriptions: PREDNISONE 10 MG TABS (PREDNISONE) 3po qd for 3days, then 2po qd for 3days, then 1po qd for 3days, then stop  #18 x 0   Entered and Authorized by:   Corwin Levins MD   Signed by:   Corwin Levins MD on 09/07/2009    Method used:   Print then Give to Patient   RxID:   [id number] OXYCODONE HCL 5 MG CAPS (OXYCODONE HCL) 1 - 2 by mouth q 6 hrs as needed  #60 x 0   Entered and Authorized by:   Corwin Levins MD   Signed by:   Corwin Levins MD on 09/07/2009   Method used:   Print then Give to Patient   RxID:   [id number] FLEXERIL 5 MG TABS (CYCLOBENZAPRINE HCL) 1 by mouth three times a day as needed  #60 x 1   Entered and Authorized by:   Corwin Levins MD   Signed by:   Corwin Levins MD on 09/07/2009   Method used:   Print then Give to Patient   RxID:   [id number]    Medication Administration  Injection # 1:    Medication: Ketorolac-Toradol 15mg     Diagnosis: ARM PAIN, RIGHT (ICD-729.5)    Route: IM    Site: RUOQ gluteus    Exp Date: 12/20/2010    Lot #: [id number]    Mfr: Nova Plus    Given by: Zella Ball Ewing (September 07, 2009 2:44 PM)  Orders Added: 1)  Ketorolac-Toradol 15mg  [J1885] 2)  Admin of Therapeutic Inj  intramuscular or subcutaneous [96372] 3)  Radiology Referral [Radiology] 4)  Est. Patient Level IV [36644]

## 2010-06-20 NOTE — Letter (Signed)
Summary: Out of Work  [facility]  [address] [place], [place] [address]   Phone: [phone]  Fax: [phone]    September 07, 2009   Employee:  Ashley Christensen    To Whom It May Concern:   For Medical reasons, please excuse the above named employee from work for the following dates:  Start:   Rest of the afternoon today , Sep 07, 2009  End:   Sep 11, 2009   -    to return to work Sep 12, 2009  If you need additional information, please feel free to contact our office.         Sincerely,    Corwin Levins MD

## 2010-06-20 NOTE — Consult Note (Signed)
Summary: [facility]  [facility]   Imported By: Sherian Rein 04/03/2010 07:44:28  _____________________________________________________________________  External Attachment:    Type:   Image     Comment:   External Document

## 2010-06-20 NOTE — Assessment & Plan Note (Signed)
Summary: cough--stc   Vital Signs:  Patient profile:   40 year old female Height:      64 inches Weight:      105.50 pounds BMI:     18.17 O2 Sat:      99 % on Room air Temp:     97.6 degrees F oral Pulse rate:   97 / minute BP sitting:   128 / 84  (left arm) Cuff size:   regular  Vitals Entered By: Zella Ball Ewing CMA (AAMA) (March 29, 2010 8:02 AM)  O2 Flow:  Room air CC: Cough, nose bleed, chest congestion/RE   Primary Care Provider:  Corwin Levins MD  CC:  Cough, nose bleed, and chest congestion/RE.  History of Present Illness: here for exam acute - pt c/o doing well yesterday in her usual state of health, when last evening developed left epistaxis with significant blood and mild discomfort left nasal area;  no trauma, fever, other bleeding or bruising, or hx of same in the past.  No recent obvious sinus or URI symtpoms except she recalls her GYN did tell with a recent exam she did have some cervical LA. (which apparently persists today).  Her first instinct was to put her head back but became dizzy, so then put her head between the knees.  Did not pinch the nose it seems and bleeding was significant, lasting 30 min.  Since that time seems to have some oozing with post nasal poolling with some cough overnight, more signifant cough and even some midl wheezing this am, but Pt denies CP, worsening sob, doe, orthopnea, pnd, worsening LE edema, palps, dizziness or syncope  No fever, wt loss, night sweats, loss of appetite or other constitutional symptoms  Pt denies new neuro symptoms such as headache, facial or extremity weakness .  Mild nervous only today - Denies worsening depressive symptoms, suicidal ideation, or panic.   No hx of nasal allergies; though did have episode of CAP last yr tx here with antibx.  No known immunodeficiency or malignancy or bleeding d/o  Incidently also menitons recent w/u for left chest mass with diag mammogram/ultrasound has shown prob benign but large mass  such as fibroadenoma;  has appt for eval per chest surgury nov 16, with option for biopsy/surgury vs q 6 mo diagnostic mammograms.    Problems Prior to Update: 1)  Wheezing  (ICD-786.07) 2)  Sinusitis- Acute-nos  (ICD-461.9) 3)  Arm Pain, Right  (ICD-729.5) 4)  Cervical Radiculopathy, Right  (ICD-723.4) 5)  Abdominal Pain, Unspecified Site  (ICD-789.00) 6)  Breast Mass, Left  (ICD-611.72) 7)  Chest Pain  (ICD-786.50) 8)  Arm Pain, Right  (ICD-729.5) 9)  Cough  (ICD-786.2) 10)  Epigastric Pain  (ICD-789.06) 11)  Weight Loss  (ICD-783.21) 12)  Depression  (ICD-311) 13)  Preventive Health Care  (ICD-V70.0) 14)  Family History of Colon Ca 1st Degree Relative <60  (ICD-V16.0) 15)  Anxiety  (ICD-300.00) 16)  Otitis Media, Acute, Bilateral  (ICD-382.9) 17)  Anemia-iron Deficiency  (ICD-280.9) 18)  Anemia Nos  (ICD-285.9)  Medications Prior to Update: 1)  Alprazolam 0.25 Mg  Tabs (Alprazolam) .Marland Kitchen.. 1 By Mouth Two Times A Day As Needed 2)  Prednisone 10 Mg Tabs (Prednisone) .... 3po Qd For 3days, Then 2po Qd For 3days, Then 1po Qd For 3days, Then Stop 3)  Azithromycin 250 Mg Tabs (Azithromycin) .... 2po Qd For 1 Day, Then 1po Qd For 4days, Then Stop 4)  Hydrocodone-Homatropine 5-1.5 Mg Tabs (Hydrocodone-Homatropine) .Marland KitchenMarland KitchenMarland Kitchen  1 Tsp By Mouth Q 6 Hrs As Needed Cough  Current Medications (verified): 1)  Alprazolam 0.25 Mg  Tabs (Alprazolam) .Marland Kitchen.. 1 By Mouth Two Times A Day As Needed  Allergies (verified): 1)  ! * Avelox 2)  ! 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 but on lean side Head:  normocephalic and atraumatic.   Eyes:  vision grossly intact, pupils equal, and pupils round.   Ears:  R ear normal.  , left tm mil erythema, sinus nontender, nares without frank blood loss at this time Nose:  no external deformity and no nasal discharge.   Mouth:  no gingival abnormalities and pharynx pink and moist.  , noted some slight right tonsillar hypertrophy Neck:  supple and cervical lymphadenopathy, tender, mobile , more right than left, more pre and post SCM bilat but few at the angle of jaw on left as well; none large or concerning Lungs:  normal respiratory effort and normal breath sounds.   Heart:  normal rate and regular rhythm.   Extremities:  no edema, no erythema    Impression & Recommendations:  Problem # 1:  NOSEBLEED (ICD-784.7)  left side , unclear etiology - more than minor per pt and seems to have had oozing overnight at well with persistent blood with cough - to ENT today if possible  Orders: TLB-CBC Platelet - w/Differential (85025-CBCD) TLB-PT (Protime) (85610-PTP) ENT Referral (ENT)  Problem # 2:  CERVICAL LYMPHADENITIS (ICD-683)  The following medications were removed from the medication list:    Azithromycin 250 Mg Tabs (Azithromycin) .Marland Kitchen... 2po qd for 1 day, then 1po qd for 4days, then stop small, tender, mobile, ? recent URI - exam o/w beningn except for left TM mild erythema - ok to follow for now, afeb;  to check wbc with above  Problem # 3:  WHEEZING (ICD-786.07)  exam benign, for cxr but suspect related to bronchial congestion with some clearing  by the time of exam this am  Orders: T-2 View CXR, Same Day (71020.5TC)  Complete Medication List: 1)  Alprazolam 0.25 Mg Tabs (Alprazolam) .Marland Kitchen.. 1 by mouth two times a day as needed  Patient Instructions: 1)  Continue all previous medications as before this visit 2)   Please go to the Lab in the basement for your blood and/or urine tests today  3)  Please go to Radiology in the basement level for your X-Ray today  4)  Please call the number on the [facility] for results of your testing 5)  You will be contacted about the referral(s) to: ENT 6)  Please schedule a follow-up appointment as needed.   Orders Added: 1)  T-2 View CXR, Same Day [71020.5TC] 2)  TLB-CBC Platelet - w/Differential [85025-CBCD] 3)  TLB-PT (Protime) [85610-PTP] 4)  ENT Referral [ENT] 5)  Est. Patient Level IV [09811]

## 2010-06-20 NOTE — Progress Notes (Signed)
Summary: Rx refill req  Phone Note Call from Patient   Caller: Patient [phone] Summary of Call: Pt called requesting refill of her Alprazolam. Her sister has recently died and she is unable to sleep and is nervous. Pt is requesting Rx be called into [facility]. Initial call taken by: Margaret Pyle, CMA,  November 14, 2009 1:54 PM    New/Updated Medications: ALPRAZOLAM 0.25 MG  TABS (ALPRAZOLAM) 1 by mouth two times a day as needed Prescriptions: ALPRAZOLAM 0.25 MG  TABS (ALPRAZOLAM) 1 by mouth two times a day as needed  #60 x 1   Entered and Authorized by:   Corwin Levins MD   Signed by:   Corwin Levins MD on 11/14/2009   Method used:   Print then Give to Patient   RxID:   [id number]  done hardcopy to LIM side B - dahlia  Corwin Levins MD  November 14, 2009 2:18 PM   Rx called into pharmacy per pt request Margaret Pyle, CMA  November 14, 2009 2:32 PM

## 2010-06-20 NOTE — Progress Notes (Signed)
Summary: Lab  Phone Note Call from Patient   Caller: Patient Summary of Call: Pt received results of labs done at employee health and had some abnormal levels. Pt requested MD review and after his review additional labs were ordered. Pt informed. Initial call taken by: Margaret Pyle, CMA,  March 20, 2010 1:26 PM

## 2010-06-22 NOTE — Letter (Signed)
Summary: [facility]   Imported By: Lennie Odor 06/14/2010 14:43:45  _____________________________________________________________________  External Attachment:    Type:   Image     Comment:   External Document

## 2010-08-01 LAB — POCT HEMOGLOBIN-HEMACUE: Hemoglobin: 12.2 g/dL (ref 12.0–15.0)

## 2010-10-06 NOTE — Op Note (Signed)
[name], [name]:  [id number]   MEDICAL RECORD NO.:  [id number]          PATIENT TYPE:  AMB   LOCATION:  SDC                           FACILITY:  [facility]   PHYSICIAN:  Kathreen Cosier, M.D.DATE OF BIRTH:  Jan 25, 1971   DATE OF PROCEDURE:  10/18/2004  DATE OF DISCHARGE:                                 OPERATIVE REPORT   PREOPERATIVE DIAGNOSES:  Multiparity.   POSTOPERATIVE DIAGNOSES:  Multiparity.   PROCEDURE:  Open laparoscopic tubal sterilization.   DESCRIPTION OF PROCEDURE:  Under general anesthesia, the patient in  lithotomy position, abdomen, perineum and vagina, prepped and draped,  bladder emptied with a straight catheter. With the speculum placed in the  vagina, the anterior lip of the cervix grasped with a hulka tenaculum. In  the umbilicus, a transverse incision made, carried down to the fascia, the  fascia wiped clean, grasped with two Kocher's. The fascia and peritoneum  opened with the Mayo scissors. The sleeve of the trocar inserted  intraperitoneally, 3 liters carbon dioxide infused intraperitoneally.  __________ scope inserted through the sleeve with the scope. The uterus,  tubes and ovaries normal. The right tube was grasped 1 inch from the  __________ and cauterized. The tube was cauterized in a total of four places  moving lateral from the first site of cautery. The procedure was done in the  exact fashion on the other side. The camera did not work. Probes removed,  CO2 allowed to escape from the peritoneal cavity. Fascia closed with one  stitch of #0 Dexon and skin closed with subcuticular stitch of 4-0 Monocryl.      BAM/MEDQ  D:  10/18/2004  T:  10/18/2004  Job:  [id number]

## 2011-04-23 ENCOUNTER — Other Ambulatory Visit: Payer: Self-pay | Admitting: Obstetrics

## 2011-04-24 ENCOUNTER — Encounter (HOSPITAL_COMMUNITY): Payer: Self-pay | Admitting: *Deleted

## 2011-05-01 ENCOUNTER — Encounter (HOSPITAL_COMMUNITY): Payer: Self-pay

## 2011-05-09 ENCOUNTER — Ambulatory Visit (HOSPITAL_COMMUNITY): Admission: RE | Admit: 2011-05-09 | Payer: 59 | Source: Ambulatory Visit | Admitting: Obstetrics

## 2011-05-09 ENCOUNTER — Encounter (HOSPITAL_COMMUNITY): Admission: RE | Payer: Self-pay | Source: Ambulatory Visit

## 2011-05-09 HISTORY — DX: Anxiety disorder, unspecified: F41.9

## 2011-05-09 SURGERY — DILATATION AND CURETTAGE /HYSTEROSCOPY
Anesthesia: Choice

## 2011-08-26 ENCOUNTER — Encounter (HOSPITAL_COMMUNITY): Payer: Self-pay | Admitting: *Deleted

## 2011-08-26 ENCOUNTER — Inpatient Hospital Stay (HOSPITAL_COMMUNITY)
Admission: AD | Admit: 2011-08-26 | Discharge: 2011-08-26 | Disposition: A | Discharge status: Home / Self Care | Payer: 59 | Source: Ambulatory Visit | Attending: Obstetrics | Admitting: Obstetrics

## 2011-08-26 DIAGNOSIS — A5901 Trichomonal vulvovaginitis: Secondary | ICD-10-CM | POA: Insufficient documentation

## 2011-08-26 DIAGNOSIS — A599 Trichomoniasis, unspecified: Secondary | ICD-10-CM

## 2011-08-26 DIAGNOSIS — N920 Excessive and frequent menstruation with regular cycle: Secondary | ICD-10-CM

## 2011-08-26 LAB — URINALYSIS, ROUTINE W REFLEX MICROSCOPIC
Bilirubin Urine: NEGATIVE
Ketones, ur: NEGATIVE mg/dL
Specific Gravity, Urine: >1.03 — ABNORMAL HIGH (ref 1.005–1.030)

## 2011-08-26 LAB — URINE MICROSCOPIC-ADD ON

## 2011-08-26 LAB — CBC
HCT: 34.1 % — ABNORMAL LOW (ref 36.0–46.0)
MCV: 79.9 fL (ref 78.0–100.0)
RBC: 4.27 MIL/uL (ref 3.87–5.11)
WBC: 4.2 10*3/uL (ref 4.0–10.5)

## 2011-08-26 MED ORDER — METRONIDAZOLE 500 MG PO TABS: 2000.0000 mg | ORAL_TABLET | Freq: Once | ORAL | Status: AC

## 2011-08-26 MED ORDER — MEDROXYPROGESTERONE ACETATE 10 MG PO TABS: 10.0000 mg | ORAL_TABLET | Freq: Every day | ORAL | Status: DC

## 2011-08-26 NOTE — Discharge Instructions (Signed)
Abnormal Uterine Bleeding Abnormal uterine bleeding can have many causes. Some cases are simply treated, while others are more serious. There are several kinds of bleeding that is considered abnormal, including:  Bleeding between periods.   Bleeding after sexual intercourse.   Spotting anytime in the menstrual cycle.   Bleeding heavier or more than normal.   Bleeding after menopause.  CAUSES  There are many causes of abnormal uterine bleeding. It can be present in teenagers, pregnant women, women during their reproductive years, and women who have reached menopause. Your caregiver will look for the more common causes depending on your age, signs, symptoms and your particular circumstance. Most cases are not serious and can be treated. Even the more serious causes, like cancer of the female organs, can be treated adequately if found in the early stages. That is why all types of bleeding should be evaluated and treated as soon as possible. DIAGNOSIS  Diagnosing the cause may take several kinds of tests. Your caregiver may:  Take a complete history of the type of bleeding.   Perform a complete physical exam and Pap smear.   Take an ultrasound on the abdomen showing a picture of the female organs and the pelvis.   Inject dye into the uterus and Fallopian tubes and X-ray them (hysterosalpingogram).   Place fluid in the uterus and do an ultrasound (sonohysterogrqphy).   Take a CT scan to examine the female organs and pelvis.   Take an MRI to examine the female organs and pelvis. There is no X-ray involved with this procedure.   Look inside the uterus with a telescope that has a light at the end (hysteroscopy).   Scrap the inside of the uterus to get tissue to examine (Dilatation and Curettage, D&C).   Look into the pelvis with a telescope that has a light at the end (laparoscopy). This is done through a very small cut (incision) in the abdomen.  TREATMENT  Treatment will depend on the  cause of the abnormal bleeding. It can include:  Doing nothing to allow the problem to take care of itself over time.   Hormone treatment.   Birth control pills.   Treating the medical condition causing the problem.   Laparoscopy.   Major or minor surgery   Destroying the lining of the uterus with electrical currant, laser, freezing or heat (uterine ablation).  HOME CARE INSTRUCTIONS   Follow your caregiver's recommendation on how to treat your problem.   See your caregiver if you missed a menstrual period and think you may be pregnant.   If you are bleeding heavily, count the number of pads/tampons you use and how often you have to change them. Tell this to your caregiver.   Avoid sexual intercourse until the problem is controlled.  SEEK MEDICAL CARE IF:   You have any kind of abnormal bleeding mentioned above.   You feel dizzy at times.   You are 41 years old and have not had a menstrual period yet.  SEEK IMMEDIATE MEDICAL CARE IF:   You pass out.   You are changing pads/tampons every 15 to 30 minutes.   You have belly (abdominal) pain.   You have a temperature of 100 F (37.8 C) or higher.   You become sweaty or weak.   You are passing large blood clots from the vagina.   You start to feel sick to your stomach (nauseous) and throw up (vomit).  Document Released: 05/07/2005 Document Revised: 04/26/2011 Document Reviewed: 09/30/2008 ExitCare   Patient Information 2012 ExitCare, LLCTrichomoniasis Trichomoniasis is an infection, caused by the Trichomonas organism, that affects both women and men. In women, the outer female genitalia and the vagina are affected. In men, the penis is mainly affected, but the prostate and other reproductive organs can also be involved. Trichomoniasis is a sexually transmitted disease (STD) and is most often passed to another person through sexual contact. The majority of people who get trichomoniasis do so from a sexual encounter and are  also at risk for other STDs. CAUSES   Sexual intercourse with an infected partner.   It can be present in swimming pools or hot tubs.  SYMPTOMS   Abnormal gray-green frothy vaginal discharge in women.   Vaginal itching and irritation in women.   Itching and irritation of the area outside the vagina in women.   Penile discharge with or without pain in males.   Inflammation of the urethra (urethritis), causing painful urination.   Bleeding after sexual intercourse.  RELATED COMPLICATIONS  Pelvic inflammatory disease.   Infection of the uterus (endometritis).   Infertility.   Tubal (ectopic) pregnancy.   It can be associated with other STDs, including gonorrhea and chlamydia, hepatitis B, and HIV.  COMPLICATIONS DURING PREGNANCY  Early (premature) delivery.   Premature rupture of the membranes (PROM).   Low birth weight.  DIAGNOSIS   Visualization of Trichomonas under the microscope from the vagina discharge.   Ph of the vagina greater than 4.5, tested with a test tape.   Trich Rapid Test.   Culture of the organism, but this is not usually needed.   It may be found on a Pap test.   Having a "strawberry cervix,"which means the cervix looks very red like a strawberry.  TREATMENT   You may be given medication to fight the infection. Inform your caregiver if you could be or are pregnant. Some medications used to treat the infection should not be taken during pregnancy.   Over-the-counter medications or creams to decrease itching or irritation may be recommended.   Your sexual partner will need to be treated if infected.  HOME CARE INSTRUCTIONS   Take all medication prescribed by your caregiver.   Take over-the-counter medication for itching or irritation as directed by your caregiver.   Do not have sexual intercourse while you have the infection.   Do not douche or wear tampons.   Discuss your infection with your partner, as your partner may have acquired  the infection from you. Or, your partner may have been the person who transmitted the infection to you.   Have your sex partner examined and treated if necessary.   Practice safe, informed, and protected sex.   See your caregiver for other STD testing.  SEEK MEDICAL CARE IF:   You still have symptoms after you finish the medication.   You have an oral temperature above 102 F (38.9 C).   You develop belly (abdominal) pain.   You have pain when you urinate.   You have bleeding after sexual intercourse.   You develop a rash.   The medication makes you sick or makes you throw up (vomit).  Document Released: 10/31/2000 Document Revised: 04/26/2011 Document Reviewed: 11/26/2008 ExitCare Patient Information 2012 ExitCare, LLC.. 

## 2011-08-26 NOTE — MAU Note (Signed)
LMP 3/25, has continued to bleed ever since, passing some clots, pain on L side.  Dx'd with trichomonas 3 months ago, was treated, not sure if partner was treated.  Having some of same sx's again.  Feels lightheaded & dizzy.

## 2011-08-26 NOTE — MAU Provider Note (Signed)
History     CSN: [id number]  Arrival date and time: 08/26/11 1102   First Provider Initiated Contact with Patient 08/26/11 1149      Chief Complaint  Patient presents with  . Vaginal Bleeding  . Abdominal Pain   HPI This is a 41 y.o. female who presents with c/o heavy menses for 3 weeks. Also thinks she may have been reexposed to Trich.  She was treated before but thinks her partner never got treated. States has had the heavy bleeding/prolonged periods before. Dr Gaynell Face had told her he wanted to do a D&C, but she never went back to schedule that. Feels weak and thinks last Hgb was "7 something".  OB History    Grav Para Term Preterm Abortions TAB SAB Ect Mult Living   2 2 2       2       Past Medical History  Diagnosis Date  . Anxiety     xanax prn  . Trichomonas     Past Surgical History  Procedure Date  . Svd     x 2  . Tubal ligation   . Left breast     lumpectomy left breast - cyst  . Colonscopy     Family History  Problem Relation Age of Onset  . Cancer Sister     History  Substance Use Topics  . Smoking status: Never Smoker   . Smokeless tobacco: Never Used  . Alcohol Use: No    Allergies:  Allergies  Allergen Reactions  . Latex Hives and Itching  . Aspirin Hives    Pt avoids ibuprofen , usually takes tylenol  . Moxifloxacin     REACTION: n/v    Prescriptions prior to admission  Medication Sig Dispense Refill  . naproxen sodium (ANAPROX) 220 MG tablet Take 220 mg by mouth daily as needed. For headache        Review of Systems  Constitutional: Positive for malaise/fatigue.  Neurological: Positive for weakness.   Vaginal itching and discharge Vaginal bleeding Some cramping No fever or other symptoms  Physical Exam   Blood pressure 126/82, pulse 83, temperature 97.5 F (36.4 C), temperature source Oral, resp. rate 16, last menstrual period 08/13/2011.  Physical Exam  Constitutional: She is oriented to person, place, and time. She  appears well-developed and well-nourished. No distress.  HENT:  Head: Normocephalic.  Cardiovascular: Normal rate.   Respiratory: Effort normal.  GI: Soft. She exhibits no distension and no mass. There is no tenderness. There is no rebound and no guarding.  Genitourinary: Uterus normal. Vaginal discharge (small to moderate blood, cultures and wet prep obtained) found.  Musculoskeletal: Normal range of motion.  Neurological: She is alert and oriented to person, place, and time.  Skin: Skin is warm and dry.  Psychiatric: She has a normal mood and affect.   CBC    Component Value Date/Time   WBC 4.2 08/26/2011 1214   RBC 4.27 08/26/2011 1214   HGB 10.7* 08/26/2011 1214   HCT 34.1* 08/26/2011 1214   PLT 384 08/26/2011 1214   MCV 79.9 08/26/2011 1214   MCH 25.1* 08/26/2011 1214   MCHC 31.4 08/26/2011 1214   RDW 15.1 08/26/2011 1214   LYMPHSABS 1.5 03/29/2010 0846   MONOABS 0.3 03/29/2010 0846   EOSABS 0.1 03/29/2010 0846   BASOSABS 0.0 03/29/2010 0846     MAU Course  Procedures  MDM Discussed with Dr Gaynell Face who wants to do a D&C. Will put her on Provera for now.  Assessment and Plan  A:  Menorrhagia      Trichomonas infection, recurrent  P:  Rx Provera 10mg  qd for 10 days. Warned of withdrawal bleed       Dr Gaynell Face Will talk to her about D&C/intraop biopsy         Call his office to schedule visit        Rx Flagyl 2gm X 1, have partner treated and abstain from [facility]  [facility] 08/26/2011, 12:46 PM

## 2011-08-27 LAB — GC/CHLAMYDIA PROBE AMP, GENITAL: Chlamydia, DNA Probe: NEGATIVE

## 2011-12-11 ENCOUNTER — Other Ambulatory Visit: Payer: Self-pay | Admitting: Internal Medicine

## 2012-01-09 ENCOUNTER — Encounter (HOSPITAL_COMMUNITY): Payer: Self-pay | Admitting: *Deleted

## 2012-01-09 ENCOUNTER — Emergency Department (INDEPENDENT_AMBULATORY_CARE_PROVIDER_SITE_OTHER): Payer: 59

## 2012-01-09 ENCOUNTER — Emergency Department (HOSPITAL_COMMUNITY)
Admission: EM | Admit: 2012-01-09 | Discharge: 2012-01-09 | Disposition: A | Discharge status: Home / Self Care | Payer: 59 | Source: Home / Self Care | Attending: Family Medicine | Admitting: Family Medicine

## 2012-01-09 DIAGNOSIS — J45909 Unspecified asthma, uncomplicated: Secondary | ICD-10-CM

## 2012-01-09 MED ORDER — IPRATROPIUM BROMIDE 0.02 % IN SOLN
0.5000 mg | Freq: Once | RESPIRATORY_TRACT | Status: AC
  Administered 2012-01-09: 0.5 mg via RESPIRATORY_TRACT

## 2012-01-09 MED ORDER — ALBUTEROL SULFATE (5 MG/ML) 0.5% IN NEBU
5.0000 mg | INHALATION_SOLUTION | Freq: Once | RESPIRATORY_TRACT | Status: AC
  Administered 2012-01-09: 5 mg via RESPIRATORY_TRACT

## 2012-01-09 MED ORDER — ALBUTEROL SULFATE (5 MG/ML) 0.5% IN NEBU
INHALATION_SOLUTION | RESPIRATORY_TRACT | Status: AC
  Filled 2012-01-09: qty 1

## 2012-01-09 MED ORDER — FLUTICASONE PROPIONATE HFA 110 MCG/ACT IN AERO: 1.0000 | INHALATION_SPRAY | Freq: Two times a day (BID) | RESPIRATORY_TRACT | Status: DC

## 2012-01-09 MED ORDER — OMEPRAZOLE 20 MG PO CPDR: 20.0000 mg | DELAYED_RELEASE_CAPSULE | Freq: Every day | ORAL | Status: DC

## 2012-01-09 MED ORDER — BENZONATATE 100 MG PO CAPS: 100.0000 mg | ORAL_CAPSULE | Freq: Three times a day (TID) | ORAL | Status: AC

## 2012-01-09 MED ORDER — PREDNISONE 20 MG PO TABS: ORAL_TABLET | ORAL | Status: AC

## 2012-01-09 NOTE — ED Notes (Signed)
Pt  Has  Asthma    She  Reports  Cough  And  Shortness io  Breath      Today  As  Well  As  A  Sensation of  heavyness in  Chest   She  Reports   She  Saw pcp  Last week  And  Was  rx  Prednisone  And  Inhalers      She  driove  Herself to  Clinic    She  Is  Alert and  Oriented  Speaking in  Complete  sentances

## 2012-01-09 NOTE — ED Notes (Signed)
Pt  REQUESTS  RX  AT  [facility]  [facility] [address]         [facility]IE  AT  [facility]  [address]  CALLED  SHE  WILL  SEND  HARD COPIES  TO  [facility]
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
## 2012-01-10 NOTE — ED Notes (Signed)
Pt  Denied  Any  Pain  An  assesment

## 2012-07-15 ENCOUNTER — Encounter: Payer: Self-pay | Admitting: Internal Medicine

## 2012-07-15 ENCOUNTER — Ambulatory Visit (INDEPENDENT_AMBULATORY_CARE_PROVIDER_SITE_OTHER): Payer: 59 | Admitting: Internal Medicine

## 2012-07-15 ENCOUNTER — Ambulatory Visit: Payer: 59

## 2012-07-15 VITALS — BP 112/90 | HR 81 | Temp 98.1°F | Ht 64.0 in | Wt 106.4 lb

## 2012-07-15 DIAGNOSIS — R350 Frequency of micturition: Secondary | ICD-10-CM

## 2012-07-15 DIAGNOSIS — Z0000 Encounter for general adult medical examination without abnormal findings: Secondary | ICD-10-CM

## 2012-07-15 DIAGNOSIS — Z0001 Encounter for general adult medical examination with abnormal findings: Secondary | ICD-10-CM | POA: Insufficient documentation

## 2012-07-15 LAB — POCT URINALYSIS DIPSTICK
Bilirubin, UA: NEGATIVE
Glucose, UA: NEGATIVE
Ketones, UA: NEGATIVE
Spec Grav, UA: 1.01

## 2012-07-15 MED ORDER — CEPHALEXIN 500 MG PO CAPS: 500.0000 mg | ORAL_CAPSULE | Freq: Four times a day (QID) | ORAL | Status: DC

## 2012-07-15 NOTE — Patient Instructions (Addendum)
Please take all new medication as prescribed Please continue all other medications as before, and refills have been done if requested. Please go to the LAB in the Basement (turn left off the elevator) for the tests to be done today You will be contacted by phone if any changes need to be made immediately.  Otherwise, you will receive a letter about your results with an explanation, but please check with MyChart first. Thank you for enrolling in MyChart. Please follow the instructions below to securely access your online medical record. MyChart allows you to send messages to your doctor, view your test results, renew your prescriptions, schedule appointments, and more. To Log into My Chart online, please go by [employer] or [employer] to [email or web address], or download the MyChart App from the [employer] of [employer] .  Your Username is: [phone] (pass [email or web address]) Please send a practice Message on Mychart later today.

## 2012-07-15 NOTE — Progress Notes (Signed)
Subjective:    Patient ID: Ashley Christensen, female    DOB: [birth year], 42 y.o.   MRN: [id number]  HPI  Here to f/u, c/o 2-3 days mild to mod urinary freq but Denies urinary symptoms such as dysuria, urgency, flank pain, hematuria or n/v, fever, chills.  Feels poorly though.  Also mentions concern of excessive thirst for over a wk for unclear reasons, drinks more than "4 cups" per day when has been less in the past, and has not been more active or sweating recently, no hx of OAB, no hx of DM, and no labs on record per emr for over 3 yrs.  Has hx of anemia improved to normal 2012, no overt bleeding or bruising.  Denies worsening depressive symptoms, suicidal ideation, or panic Past Medical History  Diagnosis Date  . Anxiety     xanax prn  . Trichomonas   . ANEMIA-IRON DEFICIENCY 07/28/2007    Qualifier: Diagnosis of  By: Jonny Ruiz MD, Len Blalock   . ANXIETY 07/28/2007    Qualifier: Diagnosis of  By: Jonny Ruiz MD, Len Blalock   . DEPRESSION 02/17/2008    Qualifier: Diagnosis of  By: Jonny Ruiz MD, Len Blalock    Past Surgical History  Procedure Laterality Date  . Svd      x 2  . Tubal ligation    . Left breast      lumpectomy left breast - cyst  . Colonscopy      reports that she has never smoked. She has never used smokeless tobacco. She reports that she does not drink alcohol or use illicit drugs. family history includes Cancer in her sister. Allergies  Allergen Reactions  . Latex Hives and Itching  . Aspirin Hives    Pt avoids ibuprofen , usually takes tylenol  . Moxifloxacin     REACTION: n/v   Current Outpatient Prescriptions on File Prior to Visit  Medication Sig Dispense Refill  . omeprazole (PRILOSEC) 20 MG capsule Take 1 capsule (20 mg total) by mouth daily.  30 capsule  0  . ALBUTEROL SULFATE HFA IN Inhale into the lungs.      . fluticasone (FLOVENT HFA) 110 MCG/ACT inhaler Inhale 1 puff into the lungs 2 (two) times daily.  1 Inhaler  12  . medroxyPROGESTERone (PROVERA) 10 MG tablet Take 1 tablet  (10 mg total) by mouth daily.  10 tablet  0  . [DISCONTINUED] Fluticasone-Salmeterol (ADVAIR DISKUS IN) Inhale into the lungs.       No current facility-administered medications on file prior to visit.    Review of Systems  Constitutional: Negative for unexpected weight change, or unusual diaphoresis  HENT: Negative for tinnitus.   Eyes: Negative for photophobia and visual disturbance.  Respiratory: Negative for choking and stridor.   Gastrointestinal: Negative for vomiting and blood in stool.  Genitourinary: Negative for hematuria and decreased urine volume.  Musculoskeletal: Negative for acute joint swelling Skin: Negative for color change and wound.  Neurological: Negative for tremors and numbness other than noted  Psychiatric/Behavioral: Negative for decreased concentration or  hyperactivity.       Objective:   Physical Exam BP 112/90  Pulse 81  Temp(Src) 98.1 F (36.7 C) (Oral)  Ht 5\' 4"  (1.626 m)  Wt 106 lb 6 oz (48.251 kg)  BMI 18.25 kg/m2  SpO2 99% VS noted, fatigued, mild nervous appaering Constitutional: Pt appears well-developed and well-nourished.  HENT: Head: NCAT.  Right Ear: External ear normal.  Left Ear: External ear normal.  Eyes: Conjunctivae  and EOM are normal. Pupils are equal, round, and reactive to light.  Neck: Normal range of motion. Neck supple.  Cardiovascular: Normal rate and regular rhythm.   Pulmonary/Chest: Effort normal and breath sounds normal.  Abd:  Soft, non-distended, + BS, no flank tender, mild low abd tender, no guarding Neurological: Pt is alert. Not confused  Skin: Skin is warm. No erythema.  Psychiatric: Pt behavior is normal.    Assessment & Plan:

## 2012-07-15 NOTE — Assessment & Plan Note (Signed)
Suspect prob cystitis, UA dip c/w pt, Mild to mod, for antibx course,  to f/u any worsening symptoms or concerns, for urine studies, also for cbc, cmet, tsh given her concern of increased thirst as well - r/o dm and other

## 2012-07-16 ENCOUNTER — Other Ambulatory Visit (INDEPENDENT_AMBULATORY_CARE_PROVIDER_SITE_OTHER): Payer: 59

## 2012-07-16 DIAGNOSIS — R350 Frequency of micturition: Secondary | ICD-10-CM

## 2012-07-16 LAB — BASIC METABOLIC PANEL
BUN: 14 mg/dL (ref 6–23)
Chloride: 102 mEq/L (ref 96–112)
Potassium: 3.6 mEq/L (ref 3.5–5.1)

## 2012-07-16 LAB — CBC WITH DIFFERENTIAL/PLATELET
Basophils Relative: 0.3 % (ref 0.0–3.0)
Eosinophils Relative: 1.9 % (ref 0.0–5.0)
HCT: 34 % — ABNORMAL LOW (ref 36.0–46.0)
Hemoglobin: 11 g/dL — ABNORMAL LOW (ref 12.0–15.0)
Lymphs Abs: 2.2 10*3/uL (ref 0.7–4.0)
Monocytes Relative: 5.9 % (ref 3.0–12.0)
Neutro Abs: 2.2 10*3/uL (ref 1.4–7.7)
RDW: 14.9 % — ABNORMAL HIGH (ref 11.5–14.6)

## 2012-07-16 LAB — HEPATIC FUNCTION PANEL
ALT: 20 U/L (ref 0–35)
AST: 23 U/L (ref 0–37)
Albumin: 4.3 g/dL (ref 3.5–5.2)
Total Protein: 8.3 g/dL (ref 6.0–8.3)

## 2012-07-16 LAB — URINALYSIS, ROUTINE W REFLEX MICROSCOPIC
Ketones, ur: NEGATIVE
Specific Gravity, Urine: >=1.03 (ref 1.000–1.030)
Urobilinogen, UA: 0.2 (ref 0.0–1.0)

## 2012-07-16 LAB — TSH: TSH: 1.35 u[IU]/mL (ref 0.35–5.50)

## 2012-07-17 LAB — URINE CULTURE: Colony Count: >=100000

## 2012-07-18 LAB — URINE CULTURE: Colony Count: >=100000

## 2012-12-24 ENCOUNTER — Encounter: Payer: Self-pay | Admitting: Gastroenterology

## 2013-01-13 ENCOUNTER — Encounter (HOSPITAL_COMMUNITY): Payer: Self-pay | Admitting: *Deleted

## 2013-01-13 ENCOUNTER — Inpatient Hospital Stay (HOSPITAL_COMMUNITY)
Admission: AD | Admit: 2013-01-13 | Discharge: 2013-01-13 | Disposition: A | Discharge status: Home / Self Care | Payer: 59 | Source: Ambulatory Visit | Attending: Obstetrics | Admitting: Obstetrics

## 2013-01-13 DIAGNOSIS — D259 Leiomyoma of uterus, unspecified: Secondary | ICD-10-CM | POA: Insufficient documentation

## 2013-01-13 DIAGNOSIS — R109 Unspecified abdominal pain: Secondary | ICD-10-CM | POA: Insufficient documentation

## 2013-01-13 DIAGNOSIS — N926 Irregular menstruation, unspecified: Secondary | ICD-10-CM | POA: Insufficient documentation

## 2013-01-13 NOTE — MAU Note (Addendum)
PT SAYS SHE HAS IRREG CYCCLES  -  SAYS SHE STARTED BLEEDING ON 8-7  THEN HAD CYCLE ON 8-14-STOPPED ON 8-19.   HAD ABD PAIN WITH CYCLE.   SAYS PAIN ON LEFT SIDE  STARTED THIS AM- MILD- THOUGHT GAS,  BUT BECAME WORSE- AT 6 PM- WENT TO B-ROOM VOIDED- WITH SHARP PAIN, NO BM.    PAIN RADIATES TO  RECTUM AND BACK.     NO MED FOR PAIN.     DID NOT CALL  DR.       DR MARSHALL- GYN DR  HAD BTL- 2005.  LAST SEX-  July- USES [facility].  LAST TIME AT DR MARSHALL- 2012- HAD PAP SMEAR.-  SUGGESTED D/C- HAS FIBROIDS.

## 2013-01-13 NOTE — MAU Note (Signed)
NOT IN LOBBY 

## 2013-01-14 ENCOUNTER — Ambulatory Visit: Payer: 59 | Admitting: Internal Medicine

## 2013-01-14 ENCOUNTER — Telehealth: Payer: Self-pay | Admitting: *Deleted

## 2013-01-14 ENCOUNTER — Other Ambulatory Visit (INDEPENDENT_AMBULATORY_CARE_PROVIDER_SITE_OTHER): Payer: 59

## 2013-01-14 DIAGNOSIS — N390 Urinary tract infection, site not specified: Secondary | ICD-10-CM

## 2013-01-14 LAB — URINALYSIS, ROUTINE W REFLEX MICROSCOPIC
Bilirubin Urine: NEGATIVE
Nitrite: POSITIVE

## 2013-01-14 NOTE — Telephone Encounter (Signed)
Ok for ua - 599.0

## 2013-01-14 NOTE — Telephone Encounter (Signed)
Patient informed order put in and can go to the lab.

## 2013-01-14 NOTE — Telephone Encounter (Signed)
Pt called states she is experiencing left lower quad pain, denies urinary burning.  Requesting a UA.  Please advise

## 2013-01-15 ENCOUNTER — Encounter: Payer: Self-pay | Admitting: Internal Medicine

## 2013-02-27 ENCOUNTER — Ambulatory Visit: Payer: 59 | Admitting: Internal Medicine

## 2013-03-12 ENCOUNTER — Ambulatory Visit: Payer: 59 | Admitting: Internal Medicine

## 2013-03-26 ENCOUNTER — Other Ambulatory Visit: Payer: Self-pay

## 2013-05-18 ENCOUNTER — Encounter: Payer: Self-pay | Admitting: Internal Medicine

## 2013-05-18 ENCOUNTER — Ambulatory Visit (INDEPENDENT_AMBULATORY_CARE_PROVIDER_SITE_OTHER): Payer: 59 | Admitting: Internal Medicine

## 2013-05-18 VITALS — BP 124/82 | HR 112 | Temp 97.8°F

## 2013-05-18 DIAGNOSIS — H9201 Otalgia, right ear: Secondary | ICD-10-CM

## 2013-05-18 DIAGNOSIS — H6640 Suppurative otitis media, unspecified, unspecified ear: Secondary | ICD-10-CM

## 2013-05-18 DIAGNOSIS — H9209 Otalgia, unspecified ear: Secondary | ICD-10-CM

## 2013-05-18 MED ORDER — AMOXICILLIN-POT CLAVULANATE 875-125 MG PO TABS: 1.0000 | ORAL_TABLET | Freq: Two times a day (BID) | ORAL | Status: AC

## 2013-05-18 MED ORDER — ANTIPYRINE-BENZOCAINE 5.4-1.4 % OT SOLN: 3.0000 [drp] | OTIC | Status: DC | PRN

## 2013-05-18 NOTE — Progress Notes (Signed)
Pre-visit discussion using our clinic review tool. No additional management support is needed unless otherwise documented below in the visit note.  

## 2013-05-18 NOTE — Patient Instructions (Addendum)
It was good to see you today.  Augmentin antibiotics twice a day for 10 days and ear drops as needed for pain -Your prescription(s) have been submitted to your pharmacy. Please take as directed and contact our office if you believe you are having problem(s) with the medication(s).  Pain symptoms not improved in next 7 days, let us know for referral to ENT, please let us know sooner if worse  Otitis Media, Adult A middle ear infection is an infection in the space behind the eardrum. The medical name for this is "otitis media." It may happen after a common cold. It is caused by a germ that starts growing in that space. You may feel swollen glands in your neck on the side of the ear infection. HOME CARE INSTRUCTIONS   Take your medicine as directed until it is gone, even if you feel better after the first few days.  Only take over-the-counter or prescription medicines for pain, discomfort, or fever as directed by your caregiver.  Occasional use of a nasal decongestant a couple times per day may help with discomfort and help the eustachian tube to drain better. Follow up with your caregiver in 10 to 14 days or as directed, to be certain that the infection has cleared. Not keeping the appointment could result in a chronic or permanent injury, pain, hearing loss and disability. If there is any problem keeping the appointment, you must call back to this facility for assistance. SEEK IMMEDIATE MEDICAL CARE IF:   You are not getting better in 2 to 3 days.  You have pain that is not controlled with medication.  You feel worse instead of better.  You cannot use the medication as directed.  You develop swelling, redness or pain around the ear or stiffness in your neck. MAKE SURE YOU:   Understand these instructions.  Will watch your condition.  Will get help right away if you are not doing well or get worse. Document Released: 02/10/2004 Document Revised: 07/30/2011 Document Reviewed:  12/02/2012 [facility] Patient Information 2014 [place], [place].

## 2013-05-18 NOTE — Progress Notes (Signed)
   Subjective:    Patient ID: Ashley Christensen, female    DOB: 04/17/1971, 42 y.o.   MRN: [id number]  Otalgia  There is pain in the right ear. This is a new problem. The current episode started 1 to 4 weeks ago. The problem occurs constantly. The problem has been gradually worsening. There has been no fever. The pain is moderate. Associated symptoms include headaches and hearing loss. Pertinent negatives include no abdominal pain, coughing, diarrhea, drainage, ear discharge, neck pain, rash, rhinorrhea, sore throat or vomiting. She has tried ear drops and acetaminophen for the symptoms. The treatment provided mild relief.   Past Medical History  Diagnosis Date  . Anxiety     xanax prn  . ANEMIA-IRON DEFICIENCY   . DEPRESSION     hx of    Review of Systems  HENT: Positive for ear pain and hearing loss. Negative for ear discharge, rhinorrhea and sore throat.   Respiratory: Negative for cough.   Gastrointestinal: Negative for vomiting, abdominal pain and diarrhea.  Musculoskeletal: Negative for neck pain.  Skin: Negative for rash.  Neurological: Positive for headaches.       Objective:   Physical Exam BP 124/82  Pulse 112  Temp(Src) 97.8 F (36.6 C) (Oral)  SpO2 99% Wt Readings from Last 3 Encounters:  01/13/13 106 lb 8 oz (48.308 kg)  07/15/12 106 lb 6 oz (48.251 kg)  01/26/10 106 lb 8 oz (48.308 kg)   Constitutional: She appears well-developed and well-nourished. No distress.  HENT: Head: Normocephalic and atraumatic. Ears: R TM obscured with dry cerumen, after irrigation: TM with yellow opaque effusion, no erythema or hemorrhage; L TM ok, no erythema or effusion; Nose: Nose normal. Mouth/Throat: Oropharynx is clear and moist. No oropharyngeal exudate.  Eyes: Conjunctivae and EOM are normal. Pupils are equal, round, and reactive to light. No scleral icterus.  Neck: Normal range of motion. Neck supple. No JVD present. No thyromegaly present.  Cardiovascular: Normal rate,  regular rhythm and normal heart sounds.  No murmur heard. No BLE edema. Pulmonary/Chest: Effort normal and breath sounds normal. No respiratory distress. She has no wheezes.  Neurological: She is alert and oriented to person, place, and time. No cranial nerve deficit. Coordination, balance, strength, speech and gait are normal.  Skin: Skin is warm and dry. No rash noted. No erythema.  Psychiatric: She has a normal mood and affect. Her behavior is normal. Judgment and thought content normal.   Lab Results  Component Value Date   WBC 4.8 07/16/2012   HGB 11.0* 07/16/2012   HCT 34.0* 07/16/2012   PLT 364.0 07/16/2012   GLUCOSE 87 07/16/2012   CHOL 150 02/04/2008   TRIG 71 02/04/2008   HDL 75.8 02/04/2008   LDLCALC 60 02/04/2008   ALT 20 07/16/2012   AST 23 07/16/2012   NA 135 07/16/2012   K 3.6 07/16/2012   CL 102 07/16/2012   CREATININE 0.7 07/16/2012   BUN 14 07/16/2012   CO2 26 07/16/2012   TSH 1.35 07/16/2012   INR 1.1 ratio* 03/29/2010        Assessment & Plan:   R ear pain - ?cerumen impaction -  irrigation today revealed R OM -   tx with topical analgesia prn and Augmentin x 10d If not improved in next few days, pt will call for refer to ENT as needed

## 2013-05-19 ENCOUNTER — Ambulatory Visit: Payer: 59 | Admitting: Internal Medicine

## 2013-08-10 ENCOUNTER — Encounter: Payer: Self-pay | Admitting: Gastroenterology

## 2014-01-09 ENCOUNTER — Emergency Department (HOSPITAL_COMMUNITY): Payer: 59

## 2014-01-09 ENCOUNTER — Encounter (HOSPITAL_COMMUNITY): Payer: Self-pay | Admitting: Emergency Medicine

## 2014-01-09 ENCOUNTER — Emergency Department (HOSPITAL_COMMUNITY)
Admission: EM | Admit: 2014-01-09 | Discharge: 2014-01-09 | Disposition: A | Discharge status: Home / Self Care | Payer: 59 | Attending: Emergency Medicine | Admitting: Emergency Medicine

## 2014-01-09 DIAGNOSIS — R079 Chest pain, unspecified: Secondary | ICD-10-CM | POA: Diagnosis present

## 2014-01-09 DIAGNOSIS — F329 Major depressive disorder, single episode, unspecified: Secondary | ICD-10-CM | POA: Diagnosis not present

## 2014-01-09 DIAGNOSIS — F411 Generalized anxiety disorder: Secondary | ICD-10-CM | POA: Insufficient documentation

## 2014-01-09 DIAGNOSIS — R209 Unspecified disturbances of skin sensation: Secondary | ICD-10-CM | POA: Insufficient documentation

## 2014-01-09 DIAGNOSIS — Z91040 Latex allergy status: Secondary | ICD-10-CM | POA: Insufficient documentation

## 2014-01-09 DIAGNOSIS — F3289 Other specified depressive episodes: Secondary | ICD-10-CM | POA: Diagnosis not present

## 2014-01-09 DIAGNOSIS — Z862 Personal history of diseases of the blood and blood-forming organs and certain disorders involving the immune mechanism: Secondary | ICD-10-CM | POA: Diagnosis not present

## 2014-01-09 DIAGNOSIS — Z79899 Other long term (current) drug therapy: Secondary | ICD-10-CM | POA: Insufficient documentation

## 2014-01-09 LAB — COMPREHENSIVE METABOLIC PANEL
ALBUMIN: 3.9 g/dL (ref 3.5–5.2)
ALT: 10 U/L (ref 0–35)
AST: 15 U/L (ref 0–37)
Alkaline Phosphatase: 52 U/L (ref 39–117)
Anion gap: 13 (ref 5–15)
BUN: 9 mg/dL (ref 6–23)
CALCIUM: 9.3 mg/dL (ref 8.4–10.5)
CO2: 21 mEq/L (ref 19–32)
Chloride: 103 mEq/L (ref 96–112)
Creatinine, Ser: 0.67 mg/dL (ref 0.50–1.10)
GFR calc non Af Amer: >90 mL/min (ref >90)
GLUCOSE: 102 mg/dL — AB (ref 70–99)
Potassium: 3.9 mEq/L (ref 3.7–5.3)
SODIUM: 137 meq/L (ref 137–147)
TOTAL PROTEIN: 7.9 g/dL (ref 6.0–8.3)
Total Bilirubin: 0.7 mg/dL (ref 0.3–1.2)

## 2014-01-09 LAB — CBC
HCT: 30.7 % — ABNORMAL LOW (ref 36.0–46.0)
Hemoglobin: 9.9 g/dL — ABNORMAL LOW (ref 12.0–15.0)
MCH: 24.4 pg — ABNORMAL LOW (ref 26.0–34.0)
MCHC: 32.2 g/dL (ref 30.0–36.0)
MCV: 75.8 fL — AB (ref 78.0–100.0)
Platelets: 384 10*3/uL (ref 150–400)
RBC: 4.05 MIL/uL (ref 3.87–5.11)
RDW: 14.9 % (ref 11.5–15.5)
WBC: 5.7 10*3/uL (ref 4.0–10.5)

## 2014-01-09 LAB — I-STAT TROPONIN, ED: Troponin i, poc: 0 ng/mL (ref 0.00–0.08)

## 2014-01-09 NOTE — ED Provider Notes (Signed)
CSN: [id number]     Arrival date & time 01/09/14  1910 History   First MD Initiated Contact with Patient 01/09/14 1917     Chief Complaint  Patient presents with  . Chest Pain     (Consider location/radiation/quality/duration/timing/severity/associated sxs/prior Treatment) Patient is a 43 y.o. female presenting with chest pain. The history is provided by the patient and medical records.  Chest Pain  This is a 43 year old female with past medical history significant for anemia, depression, anxiety, present to the ED for chest pain. Patient states pain has been intermittent over the past week, mostly localized to the left side of her chest described as an ache, lasting a few minutes with each occurence.  States today pain seems to be "jumping around her chest" and is relived with aleve.  States while driving in her car and became significantly worse with radiation into her left arm with associated paresthesias and brief SOB.  Patient denies palpitations, dizziness, weakness, diaphoresis, nausea, or vomiting.  Patient has no personal or family cardiac hx.  Patient is not a smoker.  Patient does admit to recent increased stress at home and work.  Patient also notes some abdominal cramping yesterday, none currently.  States it is time for her menstrual to start.  Denies nausea, vomiting, diarrhea, fever, chills, urinary sx, vaginal discharge, or pelvic pain.  Past Medical History  Diagnosis Date  . Anxiety     xanax prn  . ANEMIA-IRON DEFICIENCY   . DEPRESSION     hx of   Past Surgical History  Procedure Laterality Date  . Svd      x 2  . Tubal ligation    . Left breast      lumpectomy left breast - cyst  . Colonscopy     Family History  Problem Relation Age of Onset  . Cancer Sister    History  Substance Use Topics  . Smoking status: Never Smoker   . Smokeless tobacco: Never Used  . Alcohol Use: No   OB History   Grav Para Term Preterm Abortions TAB SAB Ect Mult Living   2 2 2        2      Review of Systems  Cardiovascular: Positive for chest pain.  All other systems reviewed and are negative.     Allergies  Latex; Aspirin; and Moxifloxacin  Home Medications   Prior to Admission medications   Medication Sig Start Date End Date Taking? Authorizing Provider  ALBUTEROL SULFATE HFA IN Inhale into the lungs as needed.     Historical Provider, MD  ALPRAZolam Duanne Moron) 0.25 MG tablet Take 0.25 mg by mouth 2 (two) times daily as needed for anxiety.    Historical Provider, MD  antipyrine-benzocaine Toniann Fail) otic solution Place 3-4 drops into the right ear every 2 (two) hours as needed for ear pain. 05/18/13   Rowe Clack, MD  omeprazole (PRILOSEC) 20 MG capsule Take 1 capsule (20 mg total) by mouth daily. 01/09/12 05/18/13  Adlih Moreno-Coll, MD   BP 139/75  Pulse 75  Temp(Src) 98.2 F (36.8 C) (Oral)  SpO2 100%  LMP 01/09/2014  Physical Exam  Nursing note and vitals reviewed. Constitutional: She is oriented to person, place, and time. She appears well-developed and well-nourished. No distress.  HENT:  Head: Normocephalic and atraumatic.  Mouth/Throat: Oropharynx is clear and moist.  Eyes: Conjunctivae and EOM are normal. Pupils are equal, round, and reactive to light.  Neck: Normal range of motion. Neck supple.  Cardiovascular: Normal rate, regular rhythm and normal heart sounds.   Pulmonary/Chest: Effort normal and breath sounds normal. No respiratory distress. She has no wheezes.  Abdominal: Soft. Bowel sounds are normal. There is no tenderness. There is no guarding.  Musculoskeletal: Normal range of motion.  Neurological: She is alert and oriented to person, place, and time.  Skin: Skin is warm and dry. She is not diaphoretic.  Psychiatric: She has a normal mood and affect.    ED Course  Procedures (including critical care time) Labs Review Labs Reviewed  CBC - Abnormal; Notable for the following:    Hemoglobin 9.9 (*)    HCT 30.7 (*)     MCV 75.8 (*)    MCH 24.4 (*)    All other components within normal limits  COMPREHENSIVE METABOLIC PANEL  Randolm Idol, ED    Imaging Review Dg Chest 2 View  01/09/2014   CLINICAL DATA:  Chest pain and difficulty breathing  EXAM: CHEST  2 VIEW  COMPARISON:  January 09, 2012  FINDINGS: The lungs are clear. The heart size and pulmonary vascularity are normal. No adenopathy. No pneumothorax. No bone lesions.  IMPRESSION: No edema or consolidation.   Electronically Signed   By: Lowella Grip M.D.   On: 01/09/2014 19:45     EKG Interpretation   Date/Time:  Saturday January 09 2014 19:12:58 EDT Ventricular Rate:  77 PR Interval:  125 QRS Duration: 89 QT Interval:  366 QTC Calculation: 414 R Axis:   78 Text Interpretation:  Sinus rhythm Borderline repolarization abnormality  No significant change since last tracing Confirmed by [facility]  MD, Jenny Reichmann  [phone]) on 01/09/2014 7:40:59 PM      MDM   Final diagnoses:  Chest pain, unspecified chest pain type   43 y.o. F with intermittent chest pain over the past week.  Recent episode 30 mins PTA, now resolved.  EKG NSR without ischemic changes.  Trop negative.  CXR clear.  Remainder of lab work is reassuring.  Patient has no known cardiac RF, HEART score of 1 (age).  PERC negative.  Patient has remained asx throughout ED stay.  Chest pain seems atypical and at this time, i have low suspicion for ACS, PE, dissection, or other acute cardiac event.  Patient will be discharged home with close PCP FU.  Discussed plan with patient, he/she acknowledged understanding and agreed with plan of care.  Return precautions given for new or worsening symptoms.  Larene Pickett, PA-C 01/09/14 2354

## 2014-01-09 NOTE — ED Notes (Signed)
Pt with on/off chest pain for past week.  Pt states approx 30 min ago, chest pain constant and increased.  Left arm went numb.  Pt has hx of anxiety and increased stress this week.  Pt states shortness of breath and increased chest pain with breathing.  Pt also mentions abdominal pain and neck pain.  Tearful and anxious on assessment.

## 2014-01-09 NOTE — Discharge Instructions (Signed)
Follow-up with Dr. Jenny Reichmann-- recommend calling on Monday to possibly get an earlier appt than your November one. Return to the ED for any new or worsening symptoms.

## 2014-01-19 NOTE — ED Provider Notes (Signed)
Medical screening examination/treatment/procedure(s) were performed by non-physician practitioner and as supervising physician I was immediately available for consultation/collaboration.   EKG Interpretation   Date/Time:  Saturday January 09 2014 19:12:58 EDT Ventricular Rate:  77 PR Interval:  125 QRS Duration: 89 QT Interval:  366 QTC Calculation: 414 R Axis:   78 Text Interpretation:  Sinus rhythm Borderline repolarization abnormality  No significant change since last tracing Confirmed by [facility]  MD, Jenny Reichmann  [phone]) on 01/09/2014 7:40:59 PM       Babette Relic, MD 01/19/14 1925

## 2014-02-05 ENCOUNTER — Encounter: Payer: Self-pay | Admitting: Gastroenterology

## 2014-03-05 ENCOUNTER — Other Ambulatory Visit: Payer: Self-pay

## 2014-03-22 ENCOUNTER — Encounter (HOSPITAL_COMMUNITY): Payer: Self-pay | Admitting: Emergency Medicine

## 2014-04-01 ENCOUNTER — Encounter: Payer: Self-pay | Admitting: Internal Medicine

## 2014-04-01 ENCOUNTER — Other Ambulatory Visit (INDEPENDENT_AMBULATORY_CARE_PROVIDER_SITE_OTHER): Payer: 59

## 2014-04-01 ENCOUNTER — Ambulatory Visit (INDEPENDENT_AMBULATORY_CARE_PROVIDER_SITE_OTHER): Payer: 59 | Admitting: Internal Medicine

## 2014-04-01 VITALS — BP 118/82 | HR 108 | Temp 98.1°F | Ht 64.0 in | Wt 106.4 lb

## 2014-04-01 DIAGNOSIS — H6991 Unspecified Eustachian tube disorder, right ear: Secondary | ICD-10-CM

## 2014-04-01 DIAGNOSIS — Z0000 Encounter for general adult medical examination without abnormal findings: Secondary | ICD-10-CM

## 2014-04-01 DIAGNOSIS — R232 Flushing: Secondary | ICD-10-CM

## 2014-04-01 DIAGNOSIS — N951 Menopausal and female climacteric states: Secondary | ICD-10-CM

## 2014-04-01 DIAGNOSIS — D509 Iron deficiency anemia, unspecified: Secondary | ICD-10-CM

## 2014-04-01 DIAGNOSIS — F32A Depression, unspecified: Secondary | ICD-10-CM

## 2014-04-01 DIAGNOSIS — R350 Frequency of micturition: Secondary | ICD-10-CM

## 2014-04-01 DIAGNOSIS — Z800 Family history of malignant neoplasm of digestive organs: Secondary | ICD-10-CM

## 2014-04-01 DIAGNOSIS — F411 Generalized anxiety disorder: Secondary | ICD-10-CM

## 2014-04-01 DIAGNOSIS — Z23 Encounter for immunization: Secondary | ICD-10-CM

## 2014-04-01 DIAGNOSIS — H6980 Other specified disorders of Eustachian tube, unspecified ear: Secondary | ICD-10-CM | POA: Insufficient documentation

## 2014-04-01 DIAGNOSIS — F329 Major depressive disorder, single episode, unspecified: Secondary | ICD-10-CM

## 2014-04-01 DIAGNOSIS — Z0189 Encounter for other specified special examinations: Secondary | ICD-10-CM

## 2014-04-01 DIAGNOSIS — H6981 Other specified disorders of Eustachian tube, right ear: Secondary | ICD-10-CM

## 2014-04-01 LAB — HEPATIC FUNCTION PANEL
ALBUMIN: 3.8 g/dL (ref 3.5–5.2)
ALT: 13 U/L (ref 0–35)
AST: 20 U/L (ref 0–37)
Alkaline Phosphatase: 48 U/L (ref 39–117)
Bilirubin, Direct: 0.2 mg/dL (ref 0.0–0.3)
TOTAL PROTEIN: 8 g/dL (ref 6.0–8.3)
Total Bilirubin: 1.3 mg/dL — ABNORMAL HIGH (ref 0.2–1.2)

## 2014-04-01 LAB — CBC WITH DIFFERENTIAL/PLATELET
Basophils Absolute: 0 10*3/uL (ref 0.0–0.1)
Basophils Relative: 0.8 % (ref 0.0–3.0)
EOS PCT: 1.5 % (ref 0.0–5.0)
Eosinophils Absolute: 0.1 10*3/uL (ref 0.0–0.7)
HCT: 31.6 % — ABNORMAL LOW (ref 36.0–46.0)
Hemoglobin: 9.9 g/dL — ABNORMAL LOW (ref 12.0–15.0)
LYMPHS PCT: 44.8 % (ref 12.0–46.0)
Lymphs Abs: 2.1 10*3/uL (ref 0.7–4.0)
MCHC: 31.4 g/dL (ref 30.0–36.0)
MCV: 76.2 fl — ABNORMAL LOW (ref 78.0–100.0)
Monocytes Absolute: 0.2 10*3/uL (ref 0.1–1.0)
Monocytes Relative: 5.1 % (ref 3.0–12.0)
NEUTROS PCT: 47.8 % (ref 43.0–77.0)
Neutro Abs: 2.2 10*3/uL (ref 1.4–7.7)
PLATELETS: 399 10*3/uL (ref 150.0–400.0)
RBC: 4.14 Mil/uL (ref 3.87–5.11)
RDW: 15.9 % — ABNORMAL HIGH (ref 11.5–15.5)
WBC: 4.7 10*3/uL (ref 4.0–10.5)

## 2014-04-01 LAB — BASIC METABOLIC PANEL
BUN: 15 mg/dL (ref 6–23)
CHLORIDE: 106 meq/L (ref 96–112)
CO2: 23 meq/L (ref 19–32)
CREATININE: 0.7 mg/dL (ref 0.4–1.2)
Calcium: 9.4 mg/dL (ref 8.4–10.5)
GFR: 126.57 mL/min (ref >60.00)
GLUCOSE: 88 mg/dL (ref 70–99)
Potassium: 3.6 mEq/L (ref 3.5–5.1)
Sodium: 136 mEq/L (ref 135–145)

## 2014-04-01 LAB — LIPID PANEL
CHOL/HDL RATIO: 4
Cholesterol: 200 mg/dL (ref 0–200)
HDL: 54.7 mg/dL (ref >39.00)
LDL Cholesterol: 136 mg/dL — ABNORMAL HIGH (ref 0–99)
NonHDL: 145.3
TRIGLYCERIDES: 47 mg/dL (ref 0.0–149.0)
VLDL: 9.4 mg/dL (ref 0.0–40.0)

## 2014-04-01 LAB — IBC PANEL
IRON: 21 ug/dL — AB (ref 42–145)
Saturation Ratios: 4.4 % — ABNORMAL LOW (ref 20.0–50.0)
TRANSFERRIN: 337.1 mg/dL (ref 212.0–360.0)

## 2014-04-01 LAB — TSH: TSH: 1.46 u[IU]/mL (ref 0.35–4.50)

## 2014-04-01 LAB — FOLLICLE STIMULATING HORMONE: FSH: 14.4 m[IU]/mL

## 2014-04-01 MED ORDER — ALPRAZOLAM 0.25 MG PO TABS: 0.2500 mg | ORAL_TABLET | Freq: Two times a day (BID) | ORAL | Status: DC | PRN

## 2014-04-01 MED ORDER — ESCITALOPRAM OXALATE 10 MG PO TABS: 10.0000 mg | ORAL_TABLET | Freq: Every day | ORAL | Status: DC

## 2014-04-01 NOTE — Patient Instructions (Addendum)
You had the Tetanus (Tdap) update today  Please take all new medication as prescribed - the lexapro 10 mg per day  Please consider taking OTC Mucinex as needed for the right ear, as well as trying OTC Zyrtec and/or nasacort for mild allergies  You may need to also re-start the Iron sulfate 1 per day, but will  Check lab today  Please continue all other medications as before, and refills have been done if requested.  Please have the pharmacy call with any other refills you may need.  Please continue your efforts at being more active, low cholesterol diet, and weight control.  You are otherwise up to date with prevention measures today.  Please keep your appointments with your specialists as you may have planned  You will be contacted regarding the referral for: mammogram, and colonoscopy  Please go to the LAB in the Basement (turn left off the elevator) for the tests to be done today  You will be contacted by phone if any changes need to be made immediately.  Otherwise, you will receive a letter about your results with an explanation, but please check with MyChart first.  Please remember to sign up for MyChart if you have not done so, as this will be important to you in the future with finding out test results, communicating by private email, and scheduling acute appointments online when needed.  Please return in 1 year for your yearly visit, or sooner if needed, with Lab testing done 3-5 days before

## 2014-04-01 NOTE — Progress Notes (Signed)
Pre visit review using our clinic review tool, if applicable. No additional management support is needed unless otherwise documented below in the visit note. 

## 2014-04-01 NOTE — Progress Notes (Signed)
Subjective:    Patient ID: Ashley Christensen, female    DOB: 1971-02-02, 43 y.o.   MRN: [id number]  HPI  Here for wellness and f/u;  Overall doing ok;  Pt denies CP, worsening SOB, DOE, wheezing, orthopnea, PND, worsening LE edema, palpitations, dizziness or syncope.  Pt denies neurological change such as new headache, facial or extremity weakness.  Pt denies polydipsia, polyuria, or low sugar symptoms. Pt states overall good compliance with treatment and medications, good tolerability, and has been trying to follow lower cholesterol diet.  Pt denies worsening depressive symptoms, suicidal ideation or panic. No fever, night sweats, wt loss, loss of appetite, or other constitutional symptoms.  Pt states good ability with ADL's, has low fall risk, home safety reviewed and adequate, no other significant changes in hearing or vision, and only occasionally active with exercise.  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  Has mild worsening depressive symptom but minor to her, no suicidal ideation, or panic; has ongoing anxiety, more lately. Working full time; son in [place] as well, pt is paying for that.  Pt has been putting off mammo and f/u colonscopy, but willing to be scheduled.  Sisted died with colorectal ca at 43yo.  Last colon 2009.   Family wants her to move to [place], and she is considering.  Has tried paxil in past, did not tolerate  Does c/o ongoing fatigue, but denies signficant daytime hypersomnolence.  Does have 2 yrs menorrhagia, has been d/w GYn regarding TAH. Several hot flash recnetly, ? Perimenopause.  Not taking iron otc recently last hgb < 10 a few months ago.  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 Past Medical History  Diagnosis Date  . Anxiety       xanax prn  . ANEMIA-IRON DEFICIENCY   . DEPRESSION     hx of   Past Surgical History  Procedure Laterality Date  . Svd      x 2  . Tubal ligation    . Left breast      lumpectomy left breast - cyst  . Colonscopy      reports that she has never smoked. She has never used smokeless tobacco. She reports that she does not drink alcohol or use illicit drugs. family history includes Cancer in her sister. Allergies  Allergen Reactions  . Latex Hives and Itching  . Aspirin Hives    Pt avoids ibuprofen , usually takes tylenol  . Moxifloxacin Nausea And Vomiting   Current Outpatient Prescriptions on File Prior to Visit  Medication Sig Dispense Refill  . albuterol (PROVENTIL HFA;VENTOLIN HFA) 108 (90 BASE) MCG/ACT inhaler Inhale 2 puffs into the lungs every 6 (six) hours as needed for wheezing or shortness of breath.    . ALPRAZolam (XANAX) 0.25 MG tablet Take 0.25 mg by mouth 2 (two) times daily as needed for anxiety.    . Biotin 5000 MCG CAPS Take 1 capsule by mouth every morning.    . naproxen sodium (ANAPROX) 220 MG tablet Take 440 mg by mouth 2 (two) times daily as needed (pain).    Marland Kitchen omeprazole (PRILOSEC) 20 MG capsule Take 20 mg by mouth daily as needed (heartburn).    . [DISCONTINUED] Fluticasone-Salmeterol (ADVAIR DISKUS IN)  Inhale into the lungs.     No current facility-administered medications on file prior to visit.    Review of Systems Constitutional: Negative for increased diaphoresis, other activity, appetite or other siginficant weight change  HENT: Negative for worsening hearing loss, ear pain, facial swelling, mouth sores and neck stiffness.   Eyes: Negative for other worsening pain, redness or visual disturbance.  Respiratory: Negative for shortness of breath and wheezing.   Cardiovascular: Negative for chest pain and palpitations.  Gastrointestinal: Negative for diarrhea, blood in stool, abdominal distention or other pain Genitourinary: Negative for hematuria,  flank pain or change in urine volume.  Musculoskeletal: Negative for myalgias or other joint complaints.  Skin: Negative for color change and wound.  Neurological: Negative for syncope and numbness. other than noted Hematological: Negative for adenopathy. or other swelling Psychiatric/Behavioral: Negative for hallucinations, self-injury, decreased concentration or other worsening agitation.      Objective:   Physical Exam BP 118/82 mmHg  Pulse 108  Temp(Src) 98.1 F (36.7 C) (Oral)  Ht 5\' 4"  (1.626 m)  Wt 106 lb 6 oz (48.251 kg)  BMI 18.25 kg/m2  SpO2 99% VS noted, not ill appearing Constitutional: Pt is oriented to person, place, and time. Appears well-developed and well-nourished. but somewhat thin Head: Normocephalic and atraumatic.  Right Ear: External ear normal.  Left Ear: External ear normal.  Nose: Nose normal.  Right TM minor erythema, scant wax, no canal red/swelling Mouth/Throat: Oropharynx is clear and moist.  Eyes: Conjunctivae and EOM are normal. Pupils are equal, round, and reactive to light.  Neck: Normal range of motion. Neck supple. No JVD present. No tracheal deviation present.  Cardiovascular: Normal rate, regular rhythm, normal heart sounds and intact distal pulses.   Pulmonary/Chest: Effort normal and breath sounds without rales or wheezing  Abdominal: Soft. Bowel sounds are normal. NT. No HSM  Musculoskeletal: Normal range of motion. Exhibits no edema.  Lymphadenopathy:  Has no cervical adenopathy.  Neurological: Pt is alert and oriented to person, place, and time. Pt has normal reflexes. No cranial nerve deficit. Motor grossly intact Skin: Skin is warm and dry. No rash noted.  Psychiatric:  Has 1+ nervpus mood and affect. Behavior is normal.      Assessment & Plan:

## 2014-04-01 NOTE — Addendum Note (Signed)
Addended by: Biagio Borg on: 04/01/2014 02:02 PM   Modules accepted: Orders, SmartSet

## 2014-04-01 NOTE — Assessment & Plan Note (Signed)
Mifflin for muciinex otc prn, also zyrtec 10 qd prn for prob allergies related

## 2014-04-01 NOTE — Assessment & Plan Note (Signed)
Recurrent issue, has hx of UTI at least once in past, but I ? OAB or other, for urine cx, hold antibx for now pending results

## 2014-04-01 NOTE — Assessment & Plan Note (Signed)
For f/u colonoscopy 

## 2014-04-01 NOTE — Assessment & Plan Note (Signed)
Mild at worst, ? Situational, but for lexapro 10 qd, to help anxiety as well

## 2014-04-01 NOTE — Assessment & Plan Note (Signed)
With panic attack aug 2015 , seen in ER, cont xanax prn

## 2014-04-01 NOTE — Addendum Note (Signed)
Addended by: Sharon Seller B on: 04/01/2014 02:04 PM   Modules accepted: Orders

## 2014-04-01 NOTE — Assessment & Plan Note (Signed)
For f/u iron, cbc with labs, likely needs f/u with GYN regarding ? TAH, may need re-start iron sulfate otc

## 2014-04-01 NOTE — Assessment & Plan Note (Signed)

## 2014-04-05 ENCOUNTER — Other Ambulatory Visit: Payer: Self-pay | Admitting: Internal Medicine

## 2014-04-05 ENCOUNTER — Telehealth: Payer: Self-pay | Admitting: *Deleted

## 2014-04-05 LAB — URINE CULTURE

## 2014-04-05 MED ORDER — CEPHALEXIN 500 MG PO CAPS: 500.0000 mg | ORAL_CAPSULE | Freq: Four times a day (QID) | ORAL | Status: DC

## 2014-04-05 NOTE — Telephone Encounter (Signed)
-----   Message from Biagio Borg, MD sent at 04/05/2014  3:39 PM EST ----- Left message on MyChart, pt to cont same tx, except  + urine cx   Robin or lucy to inform pt, I will do rx

## 2014-04-05 NOTE — Telephone Encounter (Signed)
Notified pt to check her mychart for labs response. Faxed add-on for urine culture...Ashley Christensen

## 2014-04-07 ENCOUNTER — Other Ambulatory Visit: Payer: Self-pay | Admitting: Internal Medicine

## 2014-04-07 DIAGNOSIS — N6320 Unspecified lump in the left breast, unspecified quadrant: Secondary | ICD-10-CM

## 2014-04-13 ENCOUNTER — Encounter: Payer: Self-pay | Admitting: Gastroenterology

## 2014-04-27 ENCOUNTER — Ambulatory Visit
Admission: RE | Admit: 2014-04-27 | Discharge: 2014-04-27 | Disposition: A | Discharge status: Home / Self Care | Payer: 59 | Source: Ambulatory Visit | Attending: Internal Medicine | Admitting: Internal Medicine

## 2014-04-27 ENCOUNTER — Other Ambulatory Visit: Payer: Self-pay | Admitting: Internal Medicine

## 2014-04-27 DIAGNOSIS — N6320 Unspecified lump in the left breast, unspecified quadrant: Secondary | ICD-10-CM

## 2014-04-27 DIAGNOSIS — N6310 Unspecified lump in the right breast, unspecified quadrant: Secondary | ICD-10-CM

## 2014-06-07 ENCOUNTER — Ambulatory Visit (AMBULATORY_SURGERY_CENTER): Payer: Self-pay

## 2014-06-07 VITALS — Ht 64.0 in | Wt 104.0 lb

## 2014-06-07 DIAGNOSIS — Z800 Family history of malignant neoplasm of digestive organs: Secondary | ICD-10-CM

## 2014-06-07 MED ORDER — MOVIPREP 100 G PO SOLR: 1.0000 | Freq: Once | ORAL | Status: DC

## 2014-06-07 NOTE — Progress Notes (Signed)
No allergies to eggs or soy No past problems with anesthesia No home oxygen No diet/weight loss meds  Has email  Emmi instructions given for colonoscopy 

## 2014-06-29 ENCOUNTER — Encounter: Payer: 59 | Admitting: Gastroenterology

## 2014-08-03 ENCOUNTER — Ambulatory Visit (INDEPENDENT_AMBULATORY_CARE_PROVIDER_SITE_OTHER): Payer: 59 | Admitting: Internal Medicine

## 2014-08-03 ENCOUNTER — Encounter: Payer: Self-pay | Admitting: Internal Medicine

## 2014-08-03 ENCOUNTER — Telehealth: Payer: Self-pay | Admitting: Gastroenterology

## 2014-08-03 VITALS — BP 120/76 | HR 100 | Temp 98.5°F | Resp 18 | Ht 64.0 in | Wt 105.0 lb

## 2014-08-03 DIAGNOSIS — J029 Acute pharyngitis, unspecified: Secondary | ICD-10-CM | POA: Insufficient documentation

## 2014-08-03 MED ORDER — AZITHROMYCIN 250 MG PO TABS: ORAL_TABLET | ORAL | Status: DC

## 2014-08-03 NOTE — Telephone Encounter (Signed)
no

## 2014-08-03 NOTE — Progress Notes (Signed)
Pre visit review using our clinic review tool, if applicable. No additional management support is needed unless otherwise documented below in the visit note. 

## 2014-08-03 NOTE — Patient Instructions (Signed)
Please take all new medication as prescribed - the antibiotic  Please continue all other medications as before, and refills have been done if requested.  Please have the pharmacy call with any other refills you may need.  Please keep your appointments with your specialists as you may have planned   

## 2014-08-03 NOTE — Progress Notes (Signed)
   Subjective:    Patient ID: Ashley Christensen, female    DOB: [birth year], 44 y.o.   MRN: [id number]  HPI   Here with 2-3 days acute onset fever, severe ST, facial pain, pressure, headache, general weakness and malaise, with mild nonprod cough, but pt denies chest pain, wheezing, increased sob or doe, orthopnea, PND, increased LE swelling, palpitations, dizziness or syncope. Pt denies new neurological symptoms such as new headache, or facial or extremity weakness or numbness  Past Medical History  Diagnosis Date  . Anxiety     xanax prn  . ANEMIA-IRON DEFICIENCY   . DEPRESSION     hx of   Past Surgical History  Procedure Laterality Date  . Svd      x 2  . Tubal ligation    . Left breast      lumpectomy left breast - cyst  . Colonscopy      reports that she has never smoked. She has never used smokeless tobacco. She reports that she does not drink alcohol or use illicit drugs. family history includes Cancer in her sister; Colon cancer in her sister. Allergies  Allergen Reactions  . Latex Hives and Itching  . Aspirin Hives    Pt avoids ibuprofen , usually takes tylenol  . Moxifloxacin Nausea And Vomiting   Current Outpatient Prescriptions on File Prior to Visit  Medication Sig Dispense Refill  . albuterol (PROVENTIL HFA;VENTOLIN HFA) 108 (90 BASE) MCG/ACT inhaler Inhale 2 puffs into the lungs every 6 (six) hours as needed for wheezing or shortness of breath.    . ALPRAZolam (XANAX) 0.25 MG tablet Take 1 tablet (0.25 mg total) by mouth 2 (two) times daily as needed for anxiety. 30 tablet 4  . Biotin 5000 MCG CAPS Take 1 capsule by mouth every morning.    Marland Kitchen MOVIPREP 100 G SOLR Take 1 kit (200 g total) by mouth once. 1 kit 0  . omeprazole (PRILOSEC) 20 MG capsule Take 20 mg by mouth daily as needed (heartburn).    . [DISCONTINUED] Fluticasone-Salmeterol (ADVAIR DISKUS IN) Inhale into the lungs.     No current facility-administered medications on file prior to visit.    Review of  Systems All otherwise neg per pt     Objective:   Physical Exam BP 120/76 mmHg  Pulse 100  Temp(Src) 98.5 F (36.9 C) (Oral)  Resp 18  Ht $R'5\' 4"'Nu$  (1.626 m)  Wt 105 lb 0.6 oz (47.646 kg)  BMI 18.02 kg/m2  SpO2 99%  LMP 07/13/2014 VS noted, mild ill Constitutional: Pt appears in no significant distress HENT: Head: NCAT.  Right Ear: External ear normal.  Left Ear: External ear normal.  Eyes: . Pupils are equal, round, and reactive to light. Conjunctivae and EOM are normal Bilat tm's with mild erythema.  Max sinus areas non tender.  Pharynx with severe erythema, + exudate Neck: Normal range of motion. Neck supple.  Cardiovascular: Normal rate and regular rhythm.   Pulmonary/Chest: Effort normal and breath sounds without rales or wheezing.  Neurological: Pt is alert. Not confused , motor grossly intact Skin: Skin is warm. No rash, no LE edema Psychiatric: Pt behavior is normal. No agitation.         Assessment & Plan:

## 2014-08-04 ENCOUNTER — Encounter: Payer: 59 | Admitting: Gastroenterology

## 2014-08-06 NOTE — Assessment & Plan Note (Signed)
Mild to mod, for antibx course,  to f/u any worsening symptoms or concerns 

## 2014-11-05 ENCOUNTER — Encounter: Payer: Self-pay | Admitting: Internal Medicine

## 2014-11-06 ENCOUNTER — Ambulatory Visit (INDEPENDENT_AMBULATORY_CARE_PROVIDER_SITE_OTHER): Payer: 59 | Admitting: Family Medicine

## 2014-11-06 ENCOUNTER — Encounter: Payer: Self-pay | Admitting: Family Medicine

## 2014-11-06 VITALS — BP 110/80 | HR 100 | Temp 98.1°F | Ht 64.0 in | Wt 104.0 lb

## 2014-11-06 DIAGNOSIS — J0100 Acute maxillary sinusitis, unspecified: Secondary | ICD-10-CM

## 2014-11-06 DIAGNOSIS — J019 Acute sinusitis, unspecified: Secondary | ICD-10-CM | POA: Insufficient documentation

## 2014-11-06 MED ORDER — AMOXICILLIN 500 MG PO CAPS: 1000.0000 mg | ORAL_CAPSULE | Freq: Two times a day (BID) | ORAL | Status: DC

## 2014-11-06 NOTE — Assessment & Plan Note (Signed)
>   7 days of symptoms, worsening, left sided maxillary face pain.  Cough likely due to PND and chest pain from cough.  Treat with 10 days antibiotics, mucolytic and irrigation.

## 2014-11-06 NOTE — Patient Instructions (Signed)
Complete amoxicillin x 10 days.  Start mucinex DM, nasal saline irrigation.  Can use ibuprofen for chest wall pain.  Call if not improving as expected or fever on antibiotics or if shortness of breath worsens.

## 2014-11-06 NOTE — [facility] Notes (Signed)
   Subjective:    Patient ID: Ashley Christensen, female    DOB: 10/25/1970, 44 y.o.   MRN: [id number]  Cough This is a new problem. The current episode started in the past 7 days. The problem has been gradually worsening. The cough is productive of sputum. Associated symptoms include chest pain, headaches, nasal congestion, postnasal drip, rhinorrhea, a sore throat, shortness of breath and weight loss. Pertinent negatives include no ear congestion, ear pain, fever, rash or wheezing. Associated symptoms comments: Sinus pressure  chest pain with cough  fatigue Headache  few episodes of diarrhea. The symptoms are aggravated by lying down. Risk factors: nonsmoker. Treatments tried: ibuprofen, delsym. The treatment provided mild relief. Her past medical history is significant for asthma. There is no history of COPD, emphysema or environmental allergies. no issues in years with asthma, does not have an  inhaler any longer      Review of Systems  Constitutional: Positive for weight loss. Negative for fever.  HENT: Positive for postnasal drip, rhinorrhea and sore throat. Negative for ear pain.   Respiratory: Positive for cough and shortness of breath. Negative for wheezing.   Cardiovascular: Positive for chest pain.  Skin: Negative for rash.  Allergic/Immunologic: Negative for environmental allergies.  Neurological: Positive for headaches.       Objective:   Physical Exam  Constitutional: Vital signs are normal. She appears well-developed and well-nourished. She is cooperative.  Non-toxic appearance. She does not appear ill. No distress.  HENT:  Head: Normocephalic.  Right Ear: Hearing, tympanic membrane, external ear and ear canal normal. Tympanic membrane is not erythematous, not retracted and not bulging.  Left Ear: Hearing, tympanic membrane, external ear and ear canal normal. Tympanic membrane is not erythematous, not retracted and not bulging.  Nose: Mucosal edema present. No rhinorrhea.  Right sinus exhibits no maxillary sinus tenderness and no frontal sinus tenderness. Left sinus exhibits maxillary sinus tenderness. Left sinus exhibits no frontal sinus tenderness.  Mouth/Throat: Uvula is midline, oropharynx is clear and moist and mucous membranes are normal.  Eyes: Conjunctivae, EOM and lids are normal. Pupils are equal, round, and reactive to light. Lids are everted and swept, no foreign bodies found.  Neck: Trachea normal and normal range of motion. Neck supple. Carotid bruit is not present. No thyroid mass and no thyromegaly present.  Cardiovascular: Normal rate, regular rhythm, S1 normal, S2 normal, normal heart sounds, intact distal pulses and normal pulses.  Exam reveals no gallop and no friction rub.   No murmur heard. Pulmonary/Chest: Effort normal and breath sounds normal. No tachypnea. No respiratory distress. She has no decreased breath sounds. She has no wheezes. She has no rhonchi. She has no rales.  Neurological: She is alert.  Skin: Skin is warm, dry and intact. No rash noted.  Psychiatric: Her speech is normal and behavior is normal. Judgment normal. Her mood appears not anxious. Cognition and memory are normal. She does not exhibit a depressed mood.          Assessment & Plan:

## 2014-11-06 NOTE — Progress Notes (Signed)
Pre visit review using our clinic review tool, if applicable. No additional management support is needed unless otherwise documented below in the visit note. 

## 2014-11-10 ENCOUNTER — Other Ambulatory Visit: Payer: Self-pay | Admitting: Internal Medicine

## 2014-11-10 NOTE — Telephone Encounter (Signed)
Done hardcopy to stephpanie

## 2014-11-12 NOTE — Telephone Encounter (Signed)
rx ready to be faxed

## 2014-12-17 ENCOUNTER — Telehealth: Payer: 59 | Admitting: Nurse Practitioner

## 2014-12-17 DIAGNOSIS — R058 Other specified cough: Secondary | ICD-10-CM

## 2014-12-17 DIAGNOSIS — R05 Cough: Secondary | ICD-10-CM

## 2014-12-17 NOTE — [place] Notes (Signed)
We are sorry that you are not feeling well.  Here is how we plan to help!  Based on what you have shared with me it looks like you have upper respiratory tract inflammation that has resulted in a significant cough.  Inflammation and infection in the upper respiratory tract is commonly called bronchitis and has four common causes:  Allergies, Viral Infections, Acid Reflux and Bacterial Infections.  Allergies, viruses and acid reflux are treated by controlling symptoms or eliminating the cause. An example might be a cough caused by taking certain blood pressure medications. You stop the cough by changing the medication. Another example might be a cough caused by acid reflux. Controlling the reflux helps control the cough.  Based on your presentation I believe you most likely have A cough due to allergies.  I recommend that you start the an over-the counter-allergy medication such as Claritin 10 mg or Zyrtec 10 mg daily.    In addition you may use A non-prescription cough medication called Robitussin DAC. Take 2 teaspoons every 8 hours or Delsym: take 2 teaspoons every 12 hours.    HOME CARE . Only take medications as instructed by your medical team. . Complete the entire course of an antibiotic. . Drink plenty of fluids and get plenty of rest. . Avoid close contacts especially the very young and the elderly . Cover your mouth if you cough or cough into your sleeve. . Always remember to wash your hands . A steam or ultrasonic humidifier can help congestion.    GET HELP RIGHT AWAY IF: . You develop worsening fever. . You become short of breath . You cough up blood. . Your symptoms persist after you have completed your treatment plan MAKE SURE YOU   Understand these instructions.  Will watch your condition.  Will get help right away if you are not doing well or get worse.  Your e-visit answers were reviewed by a board certified advanced clinical practitioner to complete your personal care  plan.  Depending on the condition, your plan could have included both over the counter or prescription medications.  If there is a problem please reply  once you have received a response from your provider.  Your safety is important to [place].  If you have drug allergies check your prescription carefully.    You can use MyChart to ask questions about today's visit, request a non-urgent call back, or ask for a work or school excuse.  You will get an e-mail in the next two days asking about your experience.  I hope that your e-visit has been valuable and will speed your recovery. Thank you for using e-visits.

## 2014-12-18 ENCOUNTER — Ambulatory Visit: Payer: 59 | Admitting: Family Medicine

## 2015-04-27 ENCOUNTER — Other Ambulatory Visit (INDEPENDENT_AMBULATORY_CARE_PROVIDER_SITE_OTHER): Payer: 59

## 2015-04-27 ENCOUNTER — Ambulatory Visit (INDEPENDENT_AMBULATORY_CARE_PROVIDER_SITE_OTHER): Payer: 59 | Admitting: Internal Medicine

## 2015-04-27 ENCOUNTER — Encounter: Payer: Self-pay | Admitting: Internal Medicine

## 2015-04-27 VITALS — BP 128/88 | HR 77 | Temp 97.7°F | Ht 64.0 in | Wt 104.0 lb

## 2015-04-27 DIAGNOSIS — R350 Frequency of micturition: Secondary | ICD-10-CM

## 2015-04-27 DIAGNOSIS — N926 Irregular menstruation, unspecified: Secondary | ICD-10-CM | POA: Diagnosis not present

## 2015-04-27 DIAGNOSIS — Z0000 Encounter for general adult medical examination without abnormal findings: Secondary | ICD-10-CM

## 2015-04-27 DIAGNOSIS — D509 Iron deficiency anemia, unspecified: Secondary | ICD-10-CM

## 2015-04-27 LAB — CBC WITH DIFFERENTIAL/PLATELET
BASOS ABS: 0 10*3/uL (ref 0.0–0.1)
Basophils Relative: 0.6 % (ref 0.0–3.0)
Eosinophils Absolute: 0.1 10*3/uL (ref 0.0–0.7)
Eosinophils Relative: 1.7 % (ref 0.0–5.0)
HEMATOCRIT: 36.9 % (ref 36.0–46.0)
HEMOGLOBIN: 11.8 g/dL — AB (ref 12.0–15.0)
LYMPHS ABS: 2.2 10*3/uL (ref 0.7–4.0)
LYMPHS PCT: 46.6 % — AB (ref 12.0–46.0)
MCHC: 32 g/dL (ref 30.0–36.0)
MCV: 84.3 fl (ref 78.0–100.0)
MONOS PCT: 8.1 % (ref 3.0–12.0)
Monocytes Absolute: 0.4 10*3/uL (ref 0.1–1.0)
NEUTROS PCT: 43 % (ref 43.0–77.0)
Neutro Abs: 2.1 10*3/uL (ref 1.4–7.7)
Platelets: 347 10*3/uL (ref 150.0–400.0)
RBC: 4.37 Mil/uL (ref 3.87–5.11)
RDW: 14.8 % (ref 11.5–15.5)
WBC: 4.8 10*3/uL (ref 4.0–10.5)

## 2015-04-27 LAB — BASIC METABOLIC PANEL
BUN: 11 mg/dL (ref 6–23)
CALCIUM: 9.5 mg/dL (ref 8.4–10.5)
CO2: 25 meq/L (ref 19–32)
CREATININE: 0.62 mg/dL (ref 0.40–1.20)
Chloride: 103 mEq/L (ref 96–112)
GFR: 133.04 mL/min (ref >60.00)
GLUCOSE: 82 mg/dL (ref 70–99)
Potassium: 3.7 mEq/L (ref 3.5–5.1)
Sodium: 137 mEq/L (ref 135–145)

## 2015-04-27 LAB — TSH: TSH: 1.07 u[IU]/mL (ref 0.35–4.50)

## 2015-04-27 LAB — HEPATIC FUNCTION PANEL
ALBUMIN: 4.3 g/dL (ref 3.5–5.2)
ALT: 9 U/L (ref 0–35)
AST: 16 U/L (ref 0–37)
Alkaline Phosphatase: 56 U/L (ref 39–117)
Bilirubin, Direct: 0.2 mg/dL (ref 0.0–0.3)
TOTAL PROTEIN: 7.9 g/dL (ref 6.0–8.3)
Total Bilirubin: 1.3 mg/dL — ABNORMAL HIGH (ref 0.2–1.2)

## 2015-04-27 LAB — URINALYSIS, ROUTINE W REFLEX MICROSCOPIC
Bilirubin Urine: NEGATIVE
Ketones, ur: NEGATIVE
NITRITE: POSITIVE — AB
Specific Gravity, Urine: 1.025 (ref 1.000–1.030)
TOTAL PROTEIN, URINE-UPE24: NEGATIVE
URINE GLUCOSE: NEGATIVE
UROBILINOGEN UA: 0.2 (ref 0.0–1.0)
pH: 6 (ref 5.0–8.0)

## 2015-04-27 LAB — LIPID PANEL
CHOLESTEROL: 200 mg/dL (ref 0–200)
HDL: 60.9 mg/dL (ref >39.00)
LDL CALC: 124 mg/dL — AB (ref 0–99)
NonHDL: 138.88
Total CHOL/HDL Ratio: 3
Triglycerides: 75 mg/dL (ref 0.0–149.0)
VLDL: 15 mg/dL (ref 0.0–40.0)

## 2015-04-27 LAB — FERRITIN: FERRITIN: 5.9 ng/mL — AB (ref 10.0–291.0)

## 2015-04-27 LAB — IBC PANEL
Iron: 31 ug/dL — ABNORMAL LOW (ref 42–145)
Saturation Ratios: 6.9 % — ABNORMAL LOW (ref 20.0–50.0)
Transferrin: 320 mg/dL (ref 212.0–360.0)

## 2015-04-27 MED ORDER — CEPHALEXIN 500 MG PO CAPS: 500.0000 mg | ORAL_CAPSULE | Freq: Four times a day (QID) | ORAL | Status: DC

## 2015-04-27 MED ORDER — OMEPRAZOLE 20 MG PO CPDR: 20.0000 mg | DELAYED_RELEASE_CAPSULE | Freq: Every day | ORAL | Status: DC | PRN

## 2015-04-27 NOTE — Assessment & Plan Note (Signed)

## 2015-04-27 NOTE — Patient Instructions (Signed)
Please take all new medication as prescribed - the antibiotic  Please continue all other medications as before, and refills have been done if requested.  Please have the pharmacy call with any other refills you may need.  Please continue your efforts at being more active, low cholesterol diet, and weight control.  You are otherwise up to date with prevention measures today.  Please keep your appointments with your specialists as you may have planned  You will be contacted regarding the referral for: GYN  Please call if you change your mind about the referral to urology  Please go to the LAB in the Basement (turn left off the elevator) for the tests to be done today  You will be contacted by phone if any changes need to be made immediately.  Otherwise, you will receive a letter about your results with an explanation, but please check with MyChart first.  Please remember to sign up for MyChart if you have not done so, as this will be important to you in the future with finding out test results, communicating by private email, and scheduling acute appointments online when needed.  Please return in 1 year for your yearly visit, or sooner if needed, with Lab testing done 3-5 days before

## 2015-04-27 NOTE — Assessment & Plan Note (Signed)
Likely UTI recurrent given hx and exam, for empiric antibx, urine cx as well

## 2015-04-27 NOTE — Progress Notes (Signed)
Pre visit review using our clinic review tool, if applicable. No additional management support is needed unless otherwise documented below in the visit note. 

## 2015-04-27 NOTE — Assessment & Plan Note (Addendum)
Has run out of iron supplement in last few wks, to re-check iron/ferritin, suspect etiology is known fibroid, and likely related to recurrent UTI situation, recurring pelvic and lower back pains

## 2015-04-27 NOTE — [place] Notes (Signed)
Subjective:    Patient ID: Ashley Christensen, female    DOB: 19-Feb-1971, 44 y.o.   MRN: [id number]  HPI  Here for wellness and f/u;  Overall doing ok;  Pt denies Chest pain, worsening SOB, DOE, wheezing, orthopnea, PND, worsening LE edema, palpitations, dizziness or syncope.  Pt denies neurological change such as new headache, facial or extremity weakness.  Pt denies polydipsia, polyuria, or low sugar symptoms. Pt states overall good compliance with treatment and medications, good tolerability, and has been trying to follow appropriate diet.  Pt denies worsening depressive symptoms, suicidal ideation or panic. No fever, night sweats, wt loss, loss of appetite, or other constitutional symptoms.  Pt states good ability with ADL's, has low fall risk, home safety reviewed and adequate, no other significant changes in hearing or vision, and only occasionally active with exercise. Writing childrens books, and traveling lately to promote, to be published very soon.  Still works for [employer] part time.  Son now at [employer], has a 46yo playing foot ball. Still havint some irregular  - sees Dr Ruthann Cancer but needs appt, also wit some uinary freq and lower back discomfort at the same time. Denies urinary symptoms such as dysuria, urgency, flank pain, hematuria or n/v, fever, chills. But has UTI about 2-3 times per yr, with urinary freq only, Not sexually active. No HIV risk, hard to gain wt Wt Readings from Last 3 Encounters:  04/27/15 104 lb (47.174 kg)  11/06/14 104 lb (47.174 kg)  08/03/14 105 lb 0.6 oz (47.646 kg)   Past Medical History  Diagnosis Date  . Anxiety     xanax prn  . ANEMIA-IRON DEFICIENCY   . DEPRESSION     hx of   Past Surgical History  Procedure Laterality Date  . Svd      x 2  . Tubal ligation    . Left breast      lumpectomy left breast - cyst  . Colonscopy      reports that she has never smoked. She has never used smokeless tobacco. She reports that she does not drink  alcohol or use illicit drugs. family history includes Cancer in her sister; Colon cancer in her sister. Allergies  Allergen Reactions  . Latex Hives and Itching  . Aspirin Hives    Pt avoids ibuprofen , usually takes tylenol  . Moxifloxacin Nausea And Vomiting   Current Outpatient Prescriptions on File Prior to Visit  Medication Sig Dispense Refill  . albuterol (PROVENTIL HFA;VENTOLIN HFA) 108 (90 BASE) MCG/ACT inhaler Inhale 2 puffs into the lungs every 6 (six) hours as needed for wheezing or shortness of breath.    . ALPRAZolam (XANAX) 0.25 MG tablet TAKE 1 TABLET BY MOUTH TWICE DAILY AS NEEDED FOR ANXIETY 30 tablet 4  . Biotin 5000 MCG CAPS Take 1 capsule by mouth every morning.    Marland Kitchen amoxicillin (AMOXIL) 500 MG capsule Take 2 capsules (1,000 mg total) by mouth 2 (two) times daily. (Patient not taking: Reported on 04/27/2015) 40 capsule 0  . [DISCONTINUED] Fluticasone-Salmeterol (ADVAIR DISKUS IN) Inhale into the lungs.     No current facility-administered medications on file prior to visit.   Review of Systems Constitutional: Negative for increased diaphoresis, other activity, appetite or siginficant weight change other than noted HENT: Negative for worsening hearing loss, ear pain, facial swelling, mouth sores and neck stiffness.   Eyes: Negative for other worsening pain, redness or visual disturbance.  Respiratory: Negative for shortness of breath and  wheezing  Cardiovascular: Negative for chest pain and palpitations.  Gastrointestinal: Negative for diarrhea, blood in stool, abdominal distention or other pain Genitourinary: Negative for hematuria, flank pain or change in urine volume.  Musculoskeletal: Negative for myalgias or other joint complaints.  Skin: Negative for color change and wound or drainage.  Neurological: Negative for syncope and numbness. other than noted Hematological: Negative for adenopathy. or other swelling Psychiatric/Behavioral: Negative for hallucinations,  SI, self-injury, decreased concentration or other worsening agitation.      Objective:   Physical Exam BP 128/88 mmHg  Pulse 77  Temp(Src) 97.7 F (36.5 C) (Oral)  Ht 5\' 4"  (1.626 m)  Wt 104 lb (47.174 kg)  BMI 17.84 kg/m2  SpO2 99% - thin for ht VS noted,  Constitutional: Pt is oriented to person, place, and time. Appears well-developed and well-nourished, in no significant distress Head: Normocephalic and atraumatic.  Right Ear: External ear normal.  Left Ear: External ear normal.  Nose: Nose normal.  Mouth/Throat: Oropharynx is clear and moist.  Eyes: Conjunctivae and EOM are normal. Pupils are equal, round, and reactive to light.  Neck: Normal range of motion. Neck supple. No JVD present. No tracheal deviation present or significant neck LA or mass Cardiovascular: Normal rate, regular rhythm, normal heart sounds and intact distal pulses.   Pulmonary/Chest: Effort normal and breath sounds without rales or wheezing  Abdominal: Soft. Bowel sounds are normal. No HSM , has some fullness firm to right low mid abd - ? Fibroid, as well as mild tender only to left low mid abd. Without guarding or rebound Musculoskeletal: Normal range of motion. Exhibits no edema.  Lymphadenopathy:  Has no cervical adenopathy.  Neurological: Pt is alert and oriented to person, place, and time. Pt has normal reflexes. No cranial nerve deficit. Motor grossly intact Skin: Skin is warm and dry. No rash noted.  Psychiatric:  Has normal mood and affect. Behavior is normal.     Assessment & Plan:

## 2015-04-29 ENCOUNTER — Other Ambulatory Visit: Payer: Self-pay | Admitting: Internal Medicine

## 2015-04-29 LAB — URINE CULTURE: Colony Count: >=100000

## 2015-04-29 MED ORDER — NITROFURANTOIN MONOHYD MACRO 100 MG PO CAPS: 100.0000 mg | ORAL_CAPSULE | Freq: Two times a day (BID) | ORAL | Status: DC

## 2015-07-04 ENCOUNTER — Telehealth: Payer: 59 | Admitting: Family

## 2015-07-04 DIAGNOSIS — M546 Pain in thoracic spine: Secondary | ICD-10-CM

## 2015-07-04 NOTE — [place] Notes (Signed)
Based on what you shared with me it looks like you have a serious condition that should be evaluated in a face to face office visit.  If you are having a true medical emergency please call 911.  If you need an urgent face to face visit, [facility] has four urgent care centers for your convenience.  . [facility]  [phone] Get Driving Directions Find a Provider at this Location  [address] [place], [place] [address] . 8 am to 8 pm Monday-Friday . 9 am to 7 pm Saturday-Sunday  . [facility]  [phone] Get Driving Directions Find a Provider at this Location  [address], [address] [place], [place] [address] . 8 am to 8 pm Monday-Friday . 9 am to 6 pm Saturday . 11 am to 6 pm Sunday   . [facility] [place]  [phone] Get Driving Directions  [address] [place], [place] [address] . 8 am to 8 pm Monday-Friday . 9 am to 4 pm Saturday-Sunday   . [facility] (a walk in primary care provider)  [phone]  Get Driving Directions Find a Provider at this Location  [address] [place], [place] [address] . 8 am to 8:30 pm Monday-Thursday . 8 am to 6 pm Friday . 8 am to 4 pm Saturday-Sunday   Your e-visit answers were reviewed by a board certified advanced clinical practitioner to complete your personal care plan.  Thank you for using e-Visits. 

## 2015-07-27 DIAGNOSIS — Z01419 Encounter for gynecological examination (general) (routine) without abnormal findings: Secondary | ICD-10-CM | POA: Diagnosis not present

## 2015-07-27 DIAGNOSIS — Z1272 Encounter for screening for malignant neoplasm of vagina: Secondary | ICD-10-CM | POA: Diagnosis not present

## 2015-07-27 LAB — PROCEDURE REPORT - SCANNED: Pap: NEGATIVE

## 2015-07-29 ENCOUNTER — Encounter: Payer: Self-pay | Admitting: Internal Medicine

## 2015-08-27 DIAGNOSIS — H5213 Myopia, bilateral: Secondary | ICD-10-CM | POA: Diagnosis not present

## 2015-08-30 ENCOUNTER — Telehealth: Payer: Self-pay | Admitting: *Deleted

## 2015-08-30 MED ORDER — ALPRAZOLAM 0.25 MG PO TABS: 0.2500 mg | ORAL_TABLET | Freq: Two times a day (BID) | ORAL | Status: DC | PRN

## 2015-08-30 MED FILL — ALPRAZolam 0.25 MG TABS: 0.25 | 15 days supply | Qty: 30 | Fill #0

## 2015-08-30 NOTE — Telephone Encounter (Signed)
Received call pt states she is having a hard time her mom is in the hospital went into a diabetic coma. Pt is states she is very anxious, not able to sleep wanting to know can she get refill on her alprazolam.../lmb

## 2015-08-30 NOTE — Telephone Encounter (Signed)
Done hardcopy to Corinne  

## 2015-08-30 NOTE — Telephone Encounter (Signed)
Medication has been sent to pharmacy.  °

## 2016-01-02 MED FILL — ALPRAZolam 0.25 MG TABS: 0.25 | 15 days supply | Qty: 30 | Fill #1

## 2016-02-14 ENCOUNTER — Encounter: Payer: Self-pay | Admitting: *Deleted

## 2016-04-17 ENCOUNTER — Encounter: Payer: 59 | Admitting: Internal Medicine

## 2016-04-25 ENCOUNTER — Other Ambulatory Visit (INDEPENDENT_AMBULATORY_CARE_PROVIDER_SITE_OTHER): Payer: 59

## 2016-04-25 ENCOUNTER — Encounter: Payer: Self-pay | Admitting: Internal Medicine

## 2016-04-25 ENCOUNTER — Ambulatory Visit (INDEPENDENT_AMBULATORY_CARE_PROVIDER_SITE_OTHER): Payer: 59 | Admitting: Internal Medicine

## 2016-04-25 VITALS — BP 128/80 | HR 81 | Temp 98.2°F | Resp 20 | Wt 104.0 lb

## 2016-04-25 DIAGNOSIS — N920 Excessive and frequent menstruation with regular cycle: Secondary | ICD-10-CM

## 2016-04-25 DIAGNOSIS — R197 Diarrhea, unspecified: Secondary | ICD-10-CM

## 2016-04-25 DIAGNOSIS — R350 Frequency of micturition: Secondary | ICD-10-CM | POA: Diagnosis not present

## 2016-04-25 DIAGNOSIS — Z0001 Encounter for general adult medical examination with abnormal findings: Secondary | ICD-10-CM | POA: Diagnosis not present

## 2016-04-25 DIAGNOSIS — D509 Iron deficiency anemia, unspecified: Secondary | ICD-10-CM | POA: Diagnosis not present

## 2016-04-25 DIAGNOSIS — G4700 Insomnia, unspecified: Secondary | ICD-10-CM

## 2016-04-25 DIAGNOSIS — F411 Generalized anxiety disorder: Secondary | ICD-10-CM

## 2016-04-25 LAB — HEPATIC FUNCTION PANEL
ALBUMIN: 4.5 g/dL (ref 3.5–5.2)
ALT: 8 U/L (ref 0–35)
AST: 17 U/L (ref 0–37)
Alkaline Phosphatase: 51 U/L (ref 39–117)
Bilirubin, Direct: 0.3 mg/dL (ref 0.0–0.3)
TOTAL PROTEIN: 8.3 g/dL (ref 6.0–8.3)
Total Bilirubin: 1.5 mg/dL — ABNORMAL HIGH (ref 0.2–1.2)

## 2016-04-25 LAB — BASIC METABOLIC PANEL
BUN: 12 mg/dL (ref 6–23)
CALCIUM: 9.9 mg/dL (ref 8.4–10.5)
CO2: 27 meq/L (ref 19–32)
CREATININE: 0.73 mg/dL (ref 0.40–1.20)
Chloride: 101 mEq/L (ref 96–112)
GFR: 109.71 mL/min (ref >60.00)
GLUCOSE: 88 mg/dL (ref 70–99)
Potassium: 4.1 mEq/L (ref 3.5–5.1)
Sodium: 136 mEq/L (ref 135–145)

## 2016-04-25 LAB — URINALYSIS, ROUTINE W REFLEX MICROSCOPIC
BILIRUBIN URINE: NEGATIVE
Ketones, ur: NEGATIVE
Nitrite: NEGATIVE
PH: 6 (ref 5.0–8.0)
SPECIFIC GRAVITY, URINE: 1.025 (ref 1.000–1.030)
TOTAL PROTEIN, URINE-UPE24: NEGATIVE
UROBILINOGEN UA: 0.2 (ref 0.0–1.0)
Urine Glucose: NEGATIVE

## 2016-04-25 LAB — LIPID PANEL
CHOLESTEROL: 204 mg/dL — AB (ref 0–200)
HDL: 64.7 mg/dL (ref >39.00)
LDL Cholesterol: 125 mg/dL — ABNORMAL HIGH (ref 0–99)
NONHDL: 139.02
Total CHOL/HDL Ratio: 3
Triglycerides: 70 mg/dL (ref 0.0–149.0)
VLDL: 14 mg/dL (ref 0.0–40.0)

## 2016-04-25 LAB — IBC PANEL
Iron: 21 ug/dL — ABNORMAL LOW (ref 42–145)
SATURATION RATIOS: 4.2 % — AB (ref 20.0–50.0)
TRANSFERRIN: 357 mg/dL (ref 212.0–360.0)

## 2016-04-25 LAB — TSH: TSH: 0.81 u[IU]/mL (ref 0.35–4.50)

## 2016-04-25 LAB — CBC WITH DIFFERENTIAL/PLATELET
BASOS ABS: 38 {cells}/uL (ref 0–200)
BASOS PCT: 1 %
EOS PCT: 2 %
Eosinophils Absolute: 76 cells/uL (ref 15–500)
HCT: 34.9 % — ABNORMAL LOW (ref 35.0–45.0)
HEMOGLOBIN: 10.6 g/dL — AB (ref 11.7–15.5)
LYMPHS ABS: 2280 {cells}/uL (ref 850–3900)
Lymphocytes Relative: 60 %
MCH: 24 pg — AB (ref 27.0–33.0)
MCHC: 30.4 g/dL — ABNORMAL LOW (ref 32.0–36.0)
MCV: 79 fL — ABNORMAL LOW (ref 80.0–100.0)
MONOS PCT: 7 %
MPV: 9.7 fL (ref 7.5–12.5)
Monocytes Absolute: 266 cells/uL (ref 200–950)
NEUTROS ABS: 1140 {cells}/uL — AB (ref 1500–7800)
Neutrophils Relative %: 30 %
PLATELETS: 437 10*3/uL — AB (ref 140–400)
RBC: 4.42 MIL/uL (ref 3.80–5.10)
RDW: 15.6 % — ABNORMAL HIGH (ref 11.0–15.0)
WBC: 3.8 10*3/uL (ref 3.8–10.8)

## 2016-04-25 MED ORDER — OMEPRAZOLE 20 MG PO CPDR: 20.0000 mg | DELAYED_RELEASE_CAPSULE | Freq: Every day | ORAL | 3 refills | Status: DC | PRN

## 2016-04-25 MED ORDER — ALBUTEROL SULFATE HFA 108 (90 BASE) MCG/ACT IN AERS: 2.0000 | INHALATION_SPRAY | Freq: Four times a day (QID) | RESPIRATORY_TRACT | 11 refills | Status: DC | PRN

## 2016-04-25 MED ORDER — ALPRAZOLAM 0.25 MG PO TABS: 0.2500 mg | ORAL_TABLET | Freq: Two times a day (BID) | ORAL | 2 refills | Status: DC | PRN

## 2016-04-25 MED FILL — ALPRAZolam 0.25 MG TABS: 0.25 | 30 days supply | Qty: 60 | Fill #0

## 2016-04-25 NOTE — [place] Notes (Signed)
Subjective:    Patient ID: Ashley Christensen, female    DOB: 1971/04/04, 45 y.o.   MRN: [id number]  HPI  Here for wellness and f/u;  Overall doing ok;  Pt denies Chest pain, worsening SOB, DOE, wheezing, orthopnea, PND, worsening LE edema, palpitations, dizziness or syncope.  Pt denies neurological change such as new headache, facial or extremity weakness.  Pt denies polydipsia, polyuria, or low sugar symptoms. Pt states overall good compliance with treatment and medications, good tolerability, and has been trying to follow appropriate diet.  Pt denies worsening depressive symptoms, suicidal ideation or panic. No fever, night sweats, wt loss, loss of appetite, or other constitutional symptoms.  Pt states good ability with ADL's, has low fall risk, home safety reviewed and adequate, no other significant changes in hearing or vision, and only occasionally active with exercise. 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   Has been tired, and increased anxiety with several panic attacks recently, more often than usual recently in past few months, also with nearly every night cant get to sleep, despite some down time and meditation now working.  She remaiins a single mother, has older son in college playing football, 1 in [employer] more academic.  Denies worsening reflux, abd pain, dysphagia, n/v, or blood, but has intermittent loose stools she attributes to possible gluten, would like checked for this. Does also have regular by heavier menses than in years past recently, has hx of iron def anemia, not seen per GYN recentlly Also,Pt c/o urinary symptoms such as dysuria for several days but no frequency, urgency, flank pain, hematuria or n/v, fever, chills. Past Medical History:  Diagnosis Date  . ANEMIA-IRON  DEFICIENCY   . Anxiety    xanax prn  . DEPRESSION    hx of   Past Surgical History:  Procedure Laterality Date  . colonscopy    . left breast     lumpectomy left breast - cyst  . SVD     x 2  . TUBAL LIGATION      reports that she has never smoked. She has never used smokeless tobacco. She reports that she does not drink alcohol or use drugs. family history includes Cancer in her sister; Colon cancer in her sister. Allergies  Allergen Reactions  . Latex Hives and Itching  . Aspirin Hives    Pt avoids ibuprofen , usually takes tylenol  . Moxifloxacin Nausea And Vomiting   Current Outpatient Prescriptions on File Prior to Visit  Medication Sig Dispense Refill  . Biotin 5000 MCG CAPS Take 1 capsule by mouth every morning.    . [DISCONTINUED] Fluticasone-Salmeterol (ADVAIR DISKUS IN) Inhale into the lungs.     No current facility-administered medications on file prior to visit.    Review of Systems Constitutional: Negative for increased diaphoresis, or other activity, appetite or siginficant weight change other than noted HENT: Negative for worsening hearing loss, ear pain, facial swelling, mouth sores and neck stiffness.   Eyes: Negative for other worsening pain, redness or visual disturbance.  Respiratory: Negative for choking or stridor Cardiovascular: Negative for other chest pain and palpitations.  Gastrointestinal: Negative for worsening diarrhea, blood in stool, or abdominal distention Genitourinary: Negative for hematuria, flank pain or change in urine volume.  Musculoskeletal: Negative  for myalgias or other joint complaints.  Skin: Negative for other color change and wound or drainage.  Neurological: Negative for syncope and numbness. other than noted Hematological: Negative for adenopathy. or other swelling Psychiatric/Behavioral: Negative for hallucinations, SI, self-injury, decreased concentration or other worsening agitation.  All other system neg per pt      Objective:   Physical Exam BP 128/80   Pulse 81   Temp 98.2 F (36.8 C) (Oral)   Resp 20   Wt 104 lb (47.2 kg)   SpO2 98%   BMI 17.85 kg/m  VS noted,  Constitutional: Pt is oriented to person, place, and time. Appears well-developed and well-nourished, in no significant distress Head: Normocephalic and atraumatic  Eyes: Conjunctivae and EOM are normal. Pupils are equal, round, and reactive to light Right Ear: External ear normal.  Left Ear: External ear normal Nose: Nose normal.  Mouth/Throat: Oropharynx is clear and moist  Neck: Normal range of motion. Neck supple. No JVD present. No tracheal deviation present or significant neck LA or mass Cardiovascular: Normal rate, regular rhythm, normal heart sounds and intact distal pulses.   Pulmonary/Chest: Effort normal and breath sounds without rales or wheezing  Abdominal: Soft. Bowel sounds are normal. NT. No HSM  Musculoskeletal: Normal range of motion. Exhibits no edema Lymphadenopathy: Has no cervical adenopathy.  Neurological: Pt is alert and oriented to person, place, and time. Pt has normal reflexes. No cranial nerve deficit. Motor grossly intact Skin: Skin is warm and dry. No rash noted or new ulcers Psychiatric:  Has nervous mood and affect. Behavior is normal.     Assessment & Plan:

## 2016-04-25 NOTE — Progress Notes (Signed)
Pre visit review using our clinic review tool, if applicable. No additional management support is needed unless otherwise documented below in the visit note. 

## 2016-04-25 NOTE — Patient Instructions (Signed)
Please try OTC Melatonin for sleep  Please continue all other medications as before, and refills have been done if requested - the alprazolam  Please have the pharmacy call with any other refills you may need.  Please continue your efforts at being more active, low cholesterol diet, and weight control.  You are otherwise up to date with prevention measures today.  Please keep your appointments with your specialists as you may have planned  Please go to the LAB in the Basement (turn left off the elevator) for the tests to be done today  You will be contacted by phone if any changes need to be made immediately.  Otherwise, you will receive a letter about your results with an explanation, but please check with MyChart first.  Please remember to sign up for MyChart if you have not done so, as this will be important to you in the future with finding out test results, communicating by private email, and scheduling acute appointments online when needed.  Please return in 1 year for your yearly visit, or sooner if needed, with Lab testing done 3-5 days before

## 2016-04-26 LAB — URINE CULTURE

## 2016-04-27 ENCOUNTER — Other Ambulatory Visit: Payer: Self-pay | Admitting: Internal Medicine

## 2016-04-27 MED ORDER — AMOXICILLIN 500 MG PO CAPS
1000.0000 mg | ORAL_CAPSULE | Freq: Two times a day (BID) | ORAL | 0 refills | Status: DC
Start: 2016-04-27 — End: 2016-04-30

## 2016-04-29 LAB — GLIA (IGA/G) + TTG IGA
Antigliadin Abs, IgA: 11 units (ref 0–19)
GLIADIN IGG: 2 U (ref 0–19)
Transglutaminase IgA: <2 U/mL (ref 0–3)

## 2016-04-30 ENCOUNTER — Other Ambulatory Visit: Payer: Self-pay | Admitting: Internal Medicine

## 2016-04-30 ENCOUNTER — Encounter: Payer: Self-pay | Admitting: Internal Medicine

## 2016-04-30 MED ORDER — AMOXICILLIN 500 MG PO CAPS: 1000.0000 mg | ORAL_CAPSULE | Freq: Two times a day (BID) | ORAL | 0 refills | Status: DC

## 2016-04-30 NOTE — Assessment & Plan Note (Signed)
For f/u as above, cont oral iron

## 2016-04-30 NOTE — Assessment & Plan Note (Signed)
For iron check, urged pt to f/u with GYN

## 2016-04-30 NOTE — Assessment & Plan Note (Signed)

## 2016-04-30 NOTE — Telephone Encounter (Signed)
Ok to let pt know med was sent again erx

## 2016-04-30 NOTE — Telephone Encounter (Signed)
It was originally sent to [facility]  Will send to [facility]

## 2016-04-30 NOTE — Assessment & Plan Note (Signed)
For melatonin qhs prn,  to f/u any worsening symptoms or concerns

## 2016-04-30 NOTE — Assessment & Plan Note (Signed)
?   IBS like vs gluten, for labs today, declines GI referral for now

## 2016-04-30 NOTE — Assessment & Plan Note (Addendum)
With mild worsening freq panic attacks, for xanax prn refill, declines counseling referral  In addition to the time spent performing CPE, I spent an additional 25 minutes face to face,in which greater than 50% of this time was spent in counseling and coordination of care for patient's acute illness as documented.

## 2016-04-30 NOTE — Assessment & Plan Note (Signed)
?   [facility], for urine studies,  to f/u any worsening symptoms or concerns

## 2016-05-01 ENCOUNTER — Encounter: Payer: Self-pay | Admitting: Internal Medicine

## 2016-05-02 MED FILL — AMOXICILLIN 500 MG CAPSULE: 500 | 7 days supply | Qty: 14 | Fill #0

## 2016-06-15 ENCOUNTER — Telehealth: Payer: Self-pay

## 2016-06-15 ENCOUNTER — Encounter: Payer: Self-pay | Admitting: Internal Medicine

## 2016-06-15 MED ORDER — PANTOPRAZOLE SODIUM 40 MG PO TBEC: 40.0000 mg | DELAYED_RELEASE_TABLET | Freq: Every day | ORAL | 3 refills | Status: DC

## 2016-06-15 MED FILL — PANTOPRAZOLE SOD DR 40 MG T: 40 | 90 days supply | Qty: 90 | Fill #0

## 2016-06-15 NOTE — Telephone Encounter (Signed)
Sent again

## 2016-06-25 ENCOUNTER — Encounter: Payer: Self-pay | Admitting: Family Medicine

## 2016-06-25 ENCOUNTER — Ambulatory Visit (INDEPENDENT_AMBULATORY_CARE_PROVIDER_SITE_OTHER): Payer: 59 | Admitting: Family Medicine

## 2016-06-25 VITALS — BP 136/94 | HR 98 | Temp 99.0°F | Ht 64.0 in | Wt 105.4 lb

## 2016-06-25 DIAGNOSIS — J101 Influenza due to other identified influenza virus with other respiratory manifestations: Secondary | ICD-10-CM

## 2016-06-25 DIAGNOSIS — R05 Cough: Secondary | ICD-10-CM

## 2016-06-25 DIAGNOSIS — R059 Cough, unspecified: Secondary | ICD-10-CM

## 2016-06-25 DIAGNOSIS — R509 Fever, unspecified: Secondary | ICD-10-CM

## 2016-06-25 LAB — POCT INFLUENZA A/B
INFLUENZA A, POC: POSITIVE — AB
Influenza B, POC: NEGATIVE

## 2016-06-25 MED ORDER — BENZONATATE 100 MG PO CAPS: 100.0000 mg | ORAL_CAPSULE | Freq: Three times a day (TID) | ORAL | 0 refills | Status: DC

## 2016-06-25 MED ORDER — OSELTAMIVIR PHOSPHATE 75 MG PO CAPS: 75.0000 mg | ORAL_CAPSULE | Freq: Two times a day (BID) | ORAL | 0 refills | Status: DC

## 2016-06-25 MED ORDER — PROMETHAZINE-DM 6.25-15 MG/5ML PO SYRP: 5.0000 mL | ORAL_SOLUTION | Freq: Four times a day (QID) | ORAL | 0 refills | Status: DC | PRN

## 2016-06-25 MED FILL — PROMETHAZINE-DM SYRUP: 6.25-15 | 6 days supply | Qty: 118 | Fill #0

## 2016-06-25 MED FILL — BENZONATATE 100 MG CAP: 100 | 6 days supply | Qty: 20 | Fill #0

## 2016-06-25 MED FILL — OSELTAMIVIR PHOS 75 MG CAP: 75 | 5 days supply | Qty: 10 | Fill #0

## 2016-06-25 NOTE — Patient Instructions (Addendum)
Feel better soon!  Influenza, Adult Influenza ("the flu") is an infection in the lungs, nose, and throat (respiratory tract). It is caused by a virus. The flu causes many common cold symptoms, as well as a high fever and body aches. It can make you feel very sick. The flu spreads easily from person to person (is contagious). Getting a flu shot (influenza vaccination) every year is the best way to prevent the flu. Follow these instructions at home:  Take over-the-counter and prescription medicines only as told by your doctor.  Use a cool mist humidifier to add moisture (humidity) to the air in your home. This can make it easier to breathe.  Rest as needed.  Drink enough fluid to keep your pee (urine) clear or pale yellow.  Cover your mouth and nose when you cough or sneeze.  Wash your hands with soap and water often, especially after you cough or sneeze. If you cannot use soap and water, use hand sanitizer.  Stay home from work or school as told by your doctor. Unless you are visiting your doctor, try to avoid leaving home until your fever has been gone for 24 hours without the use of medicine.  Keep all follow-up visits as told by your doctor. This is important. How is this prevented?  Getting a yearly (annual) flu shot is the best way to avoid getting the flu. You may get the flu shot in late summer, fall, or winter. Ask your doctor when you should get your flu shot.  Wash your hands often or use hand sanitizer often.  Avoid contact with people who are sick during cold and flu season.  Eat healthy foods.  Drink plenty of fluids.  Get enough sleep.  Exercise regularly. Contact a doctor if:  You get new symptoms.  You have:  Chest pain.  Watery poop (diarrhea).  A fever.  Your cough gets worse.  You start to have more mucus.  You feel sick to your stomach (nauseous).  You throw up (vomit). Get help right away if:  You start to be short of breath or have  trouble breathing.  Your skin or nails turn a bluish color.  You have very bad pain or stiffness in your neck.  You get a sudden headache.  You get sudden pain in your face or ear.  You cannot stop throwing up. This information is not intended to replace advice given to you by your health care provider. Make sure you discuss any questions you have with your health care provider. Document Released: 02/14/2008 Document Revised: 10/13/2015 Document Reviewed: 03/01/2015 Elsevier Interactive Patient Education  2017 [employer].

## 2016-06-25 NOTE — Progress Notes (Signed)
Pre visit review using our clinic review tool, if applicable. No additional management support is needed unless otherwise documented below in the visit note. 

## 2016-06-25 NOTE — Progress Notes (Signed)
Subjective:    Patient ID: Ashley Christensen, female    DOB: [birth year], 46 y.o.   MRN: [id number]  HPI  Ashley Christensen is a 46 year old female who presents today with myalgias that started 2 days ago.  Associated symptoms of fever, chills, sweats, sinus pressure/pain, rhinitis with clear drainage, loose stools x 2 episodes that are not frankly water, and cough that was productive one time with yellow/brown sputum but is not productive at this time. She appears acutely ill and has experienced recent sick contact exposure. She reports a remote history of asthma and states that she has not needed an inhaler in a year. She denies SOB or wheezing. Minor nausea without vomiting is also noted. Treatment at home includes ibuprofen and tylenol for fever which has provided limited benefit.  Review of Systems  Constitutional: Positive for chills, fatigue and fever.  HENT: Positive for rhinorrhea, sinus pain, sinus pressure and sneezing. Negative for sore throat.   Respiratory: Positive for cough. Negative for shortness of breath and wheezing.   Cardiovascular: Negative for chest pain and palpitations.  Gastrointestinal: Positive for nausea. Negative for abdominal pain, constipation and vomiting.  Musculoskeletal: Positive for myalgias.  Skin: Negative for rash.   Past Medical History:  Diagnosis Date  . ANEMIA-IRON DEFICIENCY   . Anxiety    xanax prn  . DEPRESSION    hx of     Social History   Social History  . Marital status: Divorced    Spouse name: N/A  . Number of children: N/A  . Years of education: N/A   Occupational History  . Not on file.   Social History Main Topics  . Smoking status: Never Smoker  . Smokeless tobacco: Never Used  . Alcohol use No  . Drug use: No  . Sexual activity: Yes    Birth control/ protection: Surgical   Other Topics Concern  . Not on file   Social History Narrative  . No narrative on file    Past Surgical History:  Procedure Laterality Date    . colonscopy    . left breast     lumpectomy left breast - cyst  . SVD     x 2  . TUBAL LIGATION      Family History  Problem Relation Age of Onset  . Cancer Sister   . Colon cancer Sister     Allergies  Allergen Reactions  . Latex Hives and Itching  . Aspirin Hives    Pt avoids ibuprofen , usually takes tylenol  . Moxifloxacin Nausea And Vomiting    Current Outpatient Prescriptions on File Prior to Visit  Medication Sig Dispense Refill  . albuterol (PROVENTIL HFA;VENTOLIN HFA) 108 (90 Base) MCG/ACT inhaler Inhale 2 puffs into the lungs every 6 (six) hours as needed for wheezing or shortness of breath. 18 g 11  . ALPRAZolam (XANAX) 0.25 MG tablet Take 1 tablet (0.25 mg total) by mouth 2 (two) times daily as needed. for anxiety 60 tablet 2  . Biotin 5000 MCG CAPS Take 1 capsule by mouth every morning.    . pantoprazole (PROTONIX) 40 MG tablet Take 1 tablet (40 mg total) by mouth daily. 90 tablet 3  . [DISCONTINUED] Fluticasone-Salmeterol (ADVAIR DISKUS IN) Inhale into the lungs.     No current facility-administered medications on file prior to visit.    Vitals were noted and recorded at 1152: BP:  136/94 Pulse:  98 SpO2:  97% Temp:  99 degrees F  Objective:   Physical Exam  Constitutional: She is oriented to person, place, and time.  Thin, optimally nourished female  HENT:  Left Ear: Tympanic membrane normal.  Nose: Rhinorrhea present. Right sinus exhibits maxillary sinus tenderness and frontal sinus tenderness. Left sinus exhibits maxillary sinus tenderness and frontal sinus tenderness.  Mouth/Throat: Mucous membranes are normal. No oropharyngeal exudate or posterior oropharyngeal erythema.  Right TM dull  Eyes: Pupils are equal, round, and reactive to light. No scleral icterus.  Neck: Neck supple.  Cardiovascular: Normal rate and regular rhythm.   Pulmonary/Chest: Effort normal and breath sounds normal. She has no wheezes. She has no rales.  Abdominal:  Soft. Bowel sounds are normal. There is no tenderness.  Lymphadenopathy:    She has cervical adenopathy.  Neurological: She is alert and oriented to person, place, and time.  Skin: Skin is warm and dry. No rash noted.       Assessment & Plan:  1. Influenza A POC Influenza A positive. Supportive treatment of rest, increased fluids, ibuprofen and tylenol for discomfort and fever as needed. Advised follow up if symptoms worsen, she is unable to keep fluids down, worsening cough or wheezing. - oseltamivir (TAMIFLU) 75 MG capsule; Take 1 capsule (75 mg total) by mouth 2 (two) times daily.  Dispense: 10 capsule; Refill: 0  2. Cough Follow up if cough worsens or she develops wheezing, or SOB.  - promethazine-dextromethorphan (PROMETHAZINE-DM) 6.25-15 MG/5ML syrup; Take 5 mLs by mouth 4 (four) times daily as needed for cough.  Dispense: 118 mL; Refill: 0 - benzonatate (TESSALON) 100 MG capsule; Take 1 capsule (100 mg total) by mouth 3 (three) times daily.  Dispense: 20 capsule; Refill: 0  Advised further evaluation if symptoms are worsening, we discussed complications of influenza and written materials provided. She voiced understanding and agreed with plan.  Delano Metz, FNP-C

## 2016-06-26 NOTE — Progress Notes (Signed)
Seen by another provider today

## 2016-08-06 ENCOUNTER — Encounter: Payer: Self-pay | Admitting: Family Medicine

## 2016-08-06 ENCOUNTER — Ambulatory Visit (INDEPENDENT_AMBULATORY_CARE_PROVIDER_SITE_OTHER): Payer: 59 | Admitting: Family Medicine

## 2016-08-06 ENCOUNTER — Ambulatory Visit (INDEPENDENT_AMBULATORY_CARE_PROVIDER_SITE_OTHER): Payer: 59

## 2016-08-06 VITALS — BP 142/96 | HR 112 | Temp 97.9°F

## 2016-08-06 DIAGNOSIS — R05 Cough: Secondary | ICD-10-CM

## 2016-08-06 DIAGNOSIS — R059 Cough, unspecified: Secondary | ICD-10-CM

## 2016-08-06 DIAGNOSIS — J0140 Acute pansinusitis, unspecified: Secondary | ICD-10-CM

## 2016-08-06 DIAGNOSIS — J029 Acute pharyngitis, unspecified: Secondary | ICD-10-CM

## 2016-08-06 LAB — POCT RAPID STREP A (OFFICE): RAPID STREP A SCREEN: NEGATIVE

## 2016-08-06 MED ORDER — ALBUTEROL SULFATE HFA 108 (90 BASE) MCG/ACT IN AERS: 2.0000 | INHALATION_SPRAY | Freq: Four times a day (QID) | RESPIRATORY_TRACT | 11 refills | Status: DC | PRN

## 2016-08-06 MED ORDER — AMOXICILLIN-POT CLAVULANATE 875-125 MG PO TABS: 1.0000 | ORAL_TABLET | Freq: Two times a day (BID) | ORAL | 0 refills | Status: DC

## 2016-08-06 MED ORDER — ALBUTEROL SULFATE (2.5 MG/3ML) 0.083% IN NEBU
2.5000 mg | INHALATION_SOLUTION | Freq: Once | RESPIRATORY_TRACT | Status: AC
  Administered 2016-08-06: 2.5 mg via RESPIRATORY_TRACT

## 2016-08-06 MED ORDER — PREDNISONE 10 MG PO TABS: ORAL_TABLET | ORAL | 0 refills | Status: DC

## 2016-08-06 MED FILL — AMOX TR-K CLV 875-125 MG TA: 875-125 | 10 days supply | Qty: 20 | Fill #0

## 2016-08-06 MED FILL — VENTOLIN HFA 90 MCG INHALER: 108 (90 BAS | 25 days supply | Qty: 18 | Fill #0

## 2016-08-06 MED FILL — predniSONE 10 MG TABS: 10 | 8 days supply | Qty: 20 | Fill #0

## 2016-08-06 NOTE — Progress Notes (Addendum)
Subjective:    Patient ID: Ashley Christensen, female    DOB: 05/16/1971, 46 y.o.   MRN: [id number]  HPI  Ashley Christensen is a 46 year old female who presents today acutely ill with 4 days of sneezing, rhinitis, and cough.  Prior to feeling ill, Ashley Christensen reports rhinitis and sinus pressure that Ashley Christensen treated with Zyrtec the week prior. Cough is productive with green/yellow, and rhinitis with clear drainage and cough causes discomfort and keeps her awake at night.  Associated symptoms of sore throat and fatigue. Ashley Christensen denies fever, but reports chills, and sweats. Treatment with Zyrtec and benzonatate, and Aleve has provided limited benefit. Ashley Christensen has a history of asthma.  No recent antibiotic therapy or recent sick contact exposure. Influenza vaccine is UTD and Ashley Christensen was positive for flu in 06/2016. Ashley Christensen is not a smoker   Review of Systems  Constitutional: Positive for chills and fatigue. Negative for fever.  HENT: Positive for congestion, rhinorrhea, sinus pain, sinus pressure, sneezing and sore throat. Negative for postnasal drip.   Respiratory: Positive for cough. Negative for shortness of breath and wheezing.   Cardiovascular: Negative for chest pain and palpitations.  Gastrointestinal: Negative for abdominal pain, diarrhea, nausea and vomiting.  Musculoskeletal: Negative for myalgias.  Skin: Negative for rash.  Neurological: Negative for dizziness, light-headedness and headaches.   Past Medical History:  Diagnosis Date  . ANEMIA-IRON DEFICIENCY   . Anxiety    xanax prn  . DEPRESSION    hx of     Social History   Social History  . Marital status: Divorced    Spouse name: N/A  . Number of children: N/A  . Years of education: N/A   Occupational History  . Not on file.   Social History Main Topics  . Smoking status: Never Smoker  . Smokeless tobacco: Never Used  . Alcohol use No  . Drug use: No  . Sexual activity: Yes    Birth control/ protection: Surgical   Other Topics Concern  .  Not on file   Social History Narrative  . No narrative on file    Past Surgical History:  Procedure Laterality Date  . colonscopy    . left breast     lumpectomy left breast - cyst  . SVD     x 2  . TUBAL LIGATION      Family History  Problem Relation Age of Onset  . Cancer Sister   . Colon cancer Sister     Allergies  Allergen Reactions  . Latex Hives and Itching  . Aspirin Hives    Pt avoids ibuprofen , usually takes tylenol  . Moxifloxacin Nausea And Vomiting    Current Outpatient Prescriptions on File Prior to Visit  Medication Sig Dispense Refill  . ALPRAZolam (XANAX) 0.25 MG tablet Take 1 tablet (0.25 mg total) by mouth 2 (two) times daily as needed. for anxiety 60 tablet 2  . benzonatate (TESSALON) 100 MG capsule Take 1 capsule (100 mg total) by mouth 3 (three) times daily. 20 capsule 0  . Biotin 5000 MCG CAPS Take 1 capsule by mouth every morning.    Marland Kitchen oseltamivir (TAMIFLU) 75 MG capsule Take 1 capsule (75 mg total) by mouth 2 (two) times daily. 10 capsule 0  . pantoprazole (PROTONIX) 40 MG tablet Take 1 tablet (40 mg total) by mouth daily. 90 tablet 3  . promethazine-dextromethorphan (PROMETHAZINE-DM) 6.25-15 MG/5ML syrup Take 5 mLs by mouth 4 (four) times daily as needed for cough. [place]  mL 0  . [DISCONTINUED] Fluticasone-Salmeterol (ADVAIR DISKUS IN) Inhale into the lungs.     No current facility-administered medications on file prior to visit.     BP (!) 142/96 (BP Location: Left Arm, Patient Position: Sitting, Cuff Size: Normal)   Pulse (!) 112   Temp 97.9 F (36.6 C) (Oral)   SpO2 98%   Recheck of pulse 99       Objective:   Physical Exam  Constitutional: Ashley Christensen is oriented to person, place, and time.  Thin, optimally nourished female  HENT:  Nose: Rhinorrhea present. Right sinus exhibits maxillary sinus tenderness and frontal sinus tenderness. Left sinus exhibits maxillary sinus tenderness and frontal sinus tenderness.  Mouth/Throat: Posterior  oropharyngeal erythema present. No oropharyngeal exudate.  TMs dull bilaterally, erythematous nares  Eyes: Pupils are equal, round, and reactive to light. No scleral icterus.  Neck: Neck supple.  Cardiovascular: Normal rate and regular rhythm.   Pulmonary/Chest: Effort normal. Ashley Christensen has wheezes.  Abdominal: Soft. Bowel sounds are normal. There is no tenderness.  Lymphadenopathy:    Ashley Christensen has cervical adenopathy.  Neurological: Ashley Christensen is alert and oriented to person, place, and time. Coordination normal.  Skin: Skin is warm and dry. No rash noted.  Psychiatric: Ashley Christensen has a normal mood and affect. Her behavior is normal. Judgment and thought content normal.       Assessment & Plan:  1. Acute pansinusitis, recurrence not specified Acutely ill presentation, with report of several days of sinus pressure and rhinitis prior to worsening symptoms and history of asthma, will treat with amoxicillin and advised follow up if symptoms do not improve with treatment, worsen, or Ashley Christensen develops a fever >101. Rapid strep is negative today. - amoxicillin-clavulanate (AUGMENTIN) 875-125 MG tablet; Take 1 tablet by mouth 2 (two) times daily.  Dispense: 20 tablet; Refill: 0  2. Cough Cough that is causing discomfort, history of asthma, and wheezing noted.  Patient was hesitant to take deep breaths lungs were auscultated. Prior history of pneumonia with similar symptoms; will obtain a chest X-ray due to acutely ill presentation and hesitancy to take deep breaths. Treat with prednisone and provide albuterol. We discussed the importance of reporting increased use of albuterol of symptoms as this may trigger asthma symptoms and indicate poor control. Advised follow up in 3 to 4 days if symptoms do not improve with treatment, worsen, or Ashley Christensen develops a fever >101. - DG Chest 2 View; Future - albuterol (PROVENTIL HFA;VENTOLIN HFA) 108 (90 Base) MCG/ACT inhaler; Inhale 2 puffs into the lungs every 6 (six) hours as needed for wheezing  or shortness of breath.  Dispense: 18 g; Refill: 11 - predniSONE (DELTASONE) 10 MG tablet; Take 4 tablets once daily for 2 days, 3 tabs daily for 2 days, 2 tabs daily for 2 days, 1 tab daily for 2 days.  Dispense: 20 tablet; Refill: 0   Delano Metz, FNP-C

## 2016-08-06 NOTE — Addendum Note (Signed)
Addended by: Wyvonne Lenz on: 08/06/2016 12:16 PM   Modules accepted: Orders

## 2016-08-06 NOTE — Patient Instructions (Addendum)
We have ordered labs or studies at this visit. It can take up to 1-2 weeks for results and processing. IF results require follow up or explanation, we will call you with instructions. Clinically stable results will be released to your [facility]. If you have not heard from [place] or cannot find your results in [facility] in 2 weeks please contact our office at [phone].  If you are not yet signed up for [facility], please consider signing up   It was a pleasure to see you today! Please let me know if I can do anything for you.  Follow up if you are not feeling better with treatment in 3 to 4 days, worsen, or you develop a fever >101.   Sinusitis, Adult Sinusitis is soreness and inflammation of your sinuses. Sinuses are hollow spaces in the bones around your face. They are located:  Around your eyes.  In the middle of your forehead.  Behind your nose.  In your cheekbones. Your sinuses and nasal passages are lined with a stringy fluid (mucus). Mucus normally drains out of your sinuses. When your nasal tissues get inflamed or swollen, the mucus can get trapped or blocked so air cannot flow through your sinuses. This lets bacteria, viruses, and funguses grow, and that leads to infection. Follow these instructions at home: Medicines   Take, use, or apply over-the-counter and prescription medicines only as told by your doctor. These may include nasal sprays.  If you were prescribed an antibiotic medicine, take it as told by your doctor. Do not stop taking the antibiotic even if you start to feel better. Hydrate and Humidify   Drink enough water to keep your pee (urine) clear or pale yellow.  Use a cool mist humidifier to keep the humidity level in your home above 50%.  Breathe in steam for 10-15 minutes, 3-4 times a day or as told by your doctor. You can do this in the bathroom while a hot shower is running.  Try not to spend time in cool or dry air. Rest   Rest as much as possible.  Sleep  with your head raised (elevated).  Make sure to get enough sleep each night. General instructions   Put a warm, moist washcloth on your face 3-4 times a day or as told by your doctor. This will help with discomfort.  Wash your hands often with soap and water. If there is no soap and water, use hand sanitizer.  Do not smoke. Avoid being around people who are smoking (secondhand smoke).  Keep all follow-up visits as told by your doctor. This is important. Contact a doctor if:  You have a fever.  Your symptoms get worse.  Your symptoms do not get better within 10 days. Get help right away if:  You have a very bad headache.  You cannot stop throwing up (vomiting).  You have pain or swelling around your face or eyes.  You have trouble seeing.  You feel confused.  Your neck is stiff.  You have trouble breathing. This information is not intended to replace advice given to you by your health care provider. Make sure you discuss any questions you have with your health care provider. Document Released: 10/24/2007 Document Revised: 01/01/2016 Document Reviewed: 03/02/2015 Elsevier Interactive Patient Education  2017 [employer].

## 2016-09-14 MED FILL — ALPRAZolam 0.25 MG TABS: 0.25 | 30 days supply | Qty: 60 | Fill #1

## 2017-01-16 ENCOUNTER — Other Ambulatory Visit: Payer: Self-pay | Admitting: Internal Medicine

## 2017-01-16 ENCOUNTER — Other Ambulatory Visit: Payer: Self-pay

## 2017-01-16 ENCOUNTER — Telehealth: Payer: Self-pay

## 2017-01-16 DIAGNOSIS — N6320 Unspecified lump in the left breast, unspecified quadrant: Secondary | ICD-10-CM

## 2017-01-16 DIAGNOSIS — Z1231 Encounter for screening mammogram for malignant neoplasm of breast: Secondary | ICD-10-CM

## 2017-01-16 NOTE — Addendum Note (Signed)
Addended by: Biagio Borg on: 01/16/2017 12:53 PM   Modules accepted: Orders

## 2017-01-16 NOTE — Telephone Encounter (Signed)
There is no such thing as a stat order for a diagnostic mammogram since this issue is not immediately life threatening  I will place an order however, thanks

## 2017-01-16 NOTE — Telephone Encounter (Signed)
Pt spoke with the breast center but they would like a STAT order for a diagnostic mammogram put in for her. She has a history of breast cyst. She is having on come up in her L breast.   [phone] (leave msg if no answer)  Please advise.

## 2017-01-24 ENCOUNTER — Other Ambulatory Visit: Payer: Self-pay | Admitting: Internal Medicine

## 2017-01-24 ENCOUNTER — Ambulatory Visit
Admission: RE | Admit: 2017-01-24 | Discharge: 2017-01-24 | Disposition: A | Discharge status: Home / Self Care | Payer: 59 | Source: Ambulatory Visit | Attending: Internal Medicine | Admitting: Internal Medicine

## 2017-01-24 DIAGNOSIS — N6320 Unspecified lump in the left breast, unspecified quadrant: Secondary | ICD-10-CM

## 2017-01-24 DIAGNOSIS — R922 Inconclusive mammogram: Secondary | ICD-10-CM | POA: Diagnosis not present

## 2017-01-24 DIAGNOSIS — N6489 Other specified disorders of breast: Secondary | ICD-10-CM | POA: Diagnosis not present

## 2017-01-25 ENCOUNTER — Other Ambulatory Visit: Payer: Self-pay | Admitting: Internal Medicine

## 2017-01-25 DIAGNOSIS — N6320 Unspecified lump in the left breast, unspecified quadrant: Secondary | ICD-10-CM

## 2017-01-28 ENCOUNTER — Ambulatory Visit
Admission: RE | Admit: 2017-01-28 | Discharge: 2017-01-28 | Disposition: A | Discharge status: Home / Self Care | Payer: 59 | Source: Ambulatory Visit | Attending: Internal Medicine | Admitting: Internal Medicine

## 2017-01-28 DIAGNOSIS — N6320 Unspecified lump in the left breast, unspecified quadrant: Secondary | ICD-10-CM

## 2017-01-28 DIAGNOSIS — N6322 Unspecified lump in the left breast, upper inner quadrant: Secondary | ICD-10-CM | POA: Diagnosis not present

## 2017-01-28 DIAGNOSIS — D242 Benign neoplasm of left breast: Secondary | ICD-10-CM | POA: Diagnosis not present

## 2017-01-28 HISTORY — PX: BREAST BIOPSY: SHX20

## 2017-04-22 ENCOUNTER — Telehealth: Payer: 59 | Admitting: Nurse Practitioner

## 2017-04-22 DIAGNOSIS — N3000 Acute cystitis without hematuria: Secondary | ICD-10-CM | POA: Diagnosis not present

## 2017-04-22 MED ORDER — NITROFURANTOIN MONOHYD MACRO 100 MG PO CAPS: 100.0000 mg | ORAL_CAPSULE | Freq: Two times a day (BID) | ORAL | 0 refills | Status: DC

## 2017-04-22 MED FILL — NITROFURANTOIN MONO-MCR 100: 100 | 5 days supply | Qty: 10 | Fill #0

## 2017-04-22 NOTE — Progress Notes (Signed)
We are sorry that you are not feeling well.  Here is how we plan to help!  Based on what you shared with me it looks like you most likely have a simple urinary tract infection.  A UTI (Urinary Tract Infection) is a bacterial infection of the bladder.  Most cases of urinary tract infections are simple to treat but a key part of your care is to encourage you to drink plenty of fluids and watch your symptoms carefully.  I have prescribe macrobid 1 po nbid for 5 days. Your symptoms should gradually improve. Call us if the burning in your urine worsens, you develop worsening fever, back pain or pelvic pain or if your symptoms do not resolve after completing the antibiotic.  Urinary tract infections can be prevented by drinking plenty of water to keep your body hydrated.  Also be sure when you wipe, wipe from front to back and don't hold it in!  If possible, empty your bladder every 4 hours.  Your e-visit answers were reviewed by a board certified advanced clinical practitioner to complete your personal care plan.  Depending on the condition, your plan could have included both over the counter or prescription medications.  If there is a problem please reply  once you have received a response from your provider.  Your safety is important to [place].  If you have drug allergies check your prescription carefully.    You can use MyChart to ask questions about today's visit, request a non-urgent call back, or ask for a work or school excuse for 24 hours related to this e-Visit. If it has been greater than 24 hours you will need to follow up with your provider, or enter a new e-Visit to address those concerns.   You will get an e-mail in the next two days asking about your experience.  I hope that your e-visit has been valuable and will speed your recovery. Thank you for using e-visits.

## 2017-05-15 ENCOUNTER — Ambulatory Visit (INDEPENDENT_AMBULATORY_CARE_PROVIDER_SITE_OTHER): Payer: 59 | Admitting: Internal Medicine

## 2017-05-15 ENCOUNTER — Encounter: Payer: Self-pay | Admitting: Internal Medicine

## 2017-05-15 ENCOUNTER — Other Ambulatory Visit (INDEPENDENT_AMBULATORY_CARE_PROVIDER_SITE_OTHER): Payer: 59

## 2017-05-15 ENCOUNTER — Other Ambulatory Visit: Payer: Self-pay | Admitting: Internal Medicine

## 2017-05-15 VITALS — BP 118/78 | HR 105 | Temp 97.9°F | Ht 64.0 in | Wt 106.0 lb

## 2017-05-15 DIAGNOSIS — E538 Deficiency of other specified B group vitamins: Secondary | ICD-10-CM

## 2017-05-15 DIAGNOSIS — Z0001 Encounter for general adult medical examination with abnormal findings: Secondary | ICD-10-CM | POA: Diagnosis not present

## 2017-05-15 DIAGNOSIS — D509 Iron deficiency anemia, unspecified: Secondary | ICD-10-CM

## 2017-05-15 DIAGNOSIS — F411 Generalized anxiety disorder: Secondary | ICD-10-CM | POA: Diagnosis not present

## 2017-05-15 DIAGNOSIS — E559 Vitamin D deficiency, unspecified: Secondary | ICD-10-CM | POA: Diagnosis not present

## 2017-05-15 DIAGNOSIS — Z114 Encounter for screening for human immunodeficiency virus [HIV]: Secondary | ICD-10-CM | POA: Diagnosis not present

## 2017-05-15 DIAGNOSIS — N920 Excessive and frequent menstruation with regular cycle: Secondary | ICD-10-CM | POA: Diagnosis not present

## 2017-05-15 LAB — URINALYSIS, ROUTINE W REFLEX MICROSCOPIC
BILIRUBIN URINE: NEGATIVE
NITRITE: NEGATIVE
Total Protein, Urine: 30 — AB
URINE GLUCOSE: NEGATIVE
UROBILINOGEN UA: 1 (ref 0.0–1.0)
pH: 5.5 (ref 5.0–8.0)

## 2017-05-15 LAB — CBC WITH DIFFERENTIAL/PLATELET
Basophils Absolute: 0 10*3/uL (ref 0.0–0.1)
Basophils Relative: 1.1 % (ref 0.0–3.0)
EOS ABS: 0.1 10*3/uL (ref 0.0–0.7)
EOS PCT: 1.8 % (ref 0.0–5.0)
HCT: 33.5 % — ABNORMAL LOW (ref 36.0–46.0)
HEMOGLOBIN: 10.7 g/dL — AB (ref 12.0–15.0)
LYMPHS ABS: 2.1 10*3/uL (ref 0.7–4.0)
Lymphocytes Relative: 54.9 % — ABNORMAL HIGH (ref 12.0–46.0)
MCHC: 31.8 g/dL (ref 30.0–36.0)
MCV: 77.5 fl — ABNORMAL LOW (ref 78.0–100.0)
MONO ABS: 0.3 10*3/uL (ref 0.1–1.0)
Monocytes Relative: 7.5 % (ref 3.0–12.0)
NEUTROS PCT: 34.7 % — AB (ref 43.0–77.0)
Neutro Abs: 1.4 10*3/uL (ref 1.4–7.7)
Platelets: 448 10*3/uL — ABNORMAL HIGH (ref 150.0–400.0)
RBC: 4.33 Mil/uL (ref 3.87–5.11)
RDW: 15.1 % (ref 11.5–15.5)
WBC: 3.9 10*3/uL — AB (ref 4.0–10.5)

## 2017-05-15 LAB — LIPID PANEL
CHOL/HDL RATIO: 3
Cholesterol: 182 mg/dL (ref 0–200)
HDL: 57.1 mg/dL (ref >39.00)
LDL Cholesterol: 108 mg/dL — ABNORMAL HIGH (ref 0–99)
NONHDL: 124.88
Triglycerides: 85 mg/dL (ref 0.0–149.0)
VLDL: 17 mg/dL (ref 0.0–40.0)

## 2017-05-15 LAB — BASIC METABOLIC PANEL
BUN: 16 mg/dL (ref 6–23)
CO2: 25 meq/L (ref 19–32)
Calcium: 9.3 mg/dL (ref 8.4–10.5)
Chloride: 103 mEq/L (ref 96–112)
Creatinine, Ser: 0.75 mg/dL (ref 0.40–1.20)
GFR: 105.86 mL/min (ref >60.00)
GLUCOSE: 91 mg/dL (ref 70–99)
POTASSIUM: 3.6 meq/L (ref 3.5–5.1)
SODIUM: 136 meq/L (ref 135–145)

## 2017-05-15 LAB — HEPATIC FUNCTION PANEL
ALT: 9 U/L (ref 0–35)
AST: 16 U/L (ref 0–37)
Albumin: 4.5 g/dL (ref 3.5–5.2)
Alkaline Phosphatase: 50 U/L (ref 39–117)
BILIRUBIN DIRECT: 0.2 mg/dL (ref 0.0–0.3)
BILIRUBIN TOTAL: 1.2 mg/dL (ref 0.2–1.2)
Total Protein: 8.2 g/dL (ref 6.0–8.3)

## 2017-05-15 LAB — VITAMIN B12: Vitamin B-12: 524 pg/mL (ref 211–911)

## 2017-05-15 LAB — IBC PANEL
Iron: 26 ug/dL — ABNORMAL LOW (ref 42–145)
SATURATION RATIOS: 5.1 % — AB (ref 20.0–50.0)
Transferrin: 366 mg/dL — ABNORMAL HIGH (ref 212.0–360.0)

## 2017-05-15 LAB — VITAMIN D 25 HYDROXY (VIT D DEFICIENCY, FRACTURES): VITD: 9.16 ng/mL — ABNORMAL LOW (ref 30.00–100.00)

## 2017-05-15 LAB — TSH: TSH: 1.46 u[IU]/mL (ref 0.35–4.50)

## 2017-05-15 MED ORDER — PANTOPRAZOLE SODIUM 40 MG PO TBEC: 40.0000 mg | DELAYED_RELEASE_TABLET | Freq: Every day | ORAL | 3 refills | Status: DC

## 2017-05-15 MED ORDER — VITAMIN D (ERGOCALCIFEROL) 1.25 MG (50000 UNIT) PO CAPS: 50000.0000 [IU] | ORAL_CAPSULE | ORAL | 0 refills | Status: DC

## 2017-05-15 MED ORDER — ALPRAZOLAM 0.25 MG PO TABS: 0.2500 mg | ORAL_TABLET | Freq: Two times a day (BID) | ORAL | 2 refills | Status: DC | PRN

## 2017-05-15 MED FILL — ALPRAZolam 0.25 MG TABS: 0.25 | 30 days supply | Qty: 60 | Fill #0

## 2017-05-15 MED FILL — PANTOPRAZOLE SOD DR 40 MG T: 40 | 90 days supply | Qty: 90 | Fill #0

## 2017-05-15 NOTE — Assessment & Plan Note (Signed)
Also for f/u Vit D level,  to f/u any worsening symptoms or concerns

## 2017-05-15 NOTE — Assessment & Plan Note (Signed)

## 2017-05-15 NOTE — Assessment & Plan Note (Addendum)
Also for f/u cbc, refer new GYN,  to f/u any worsening symptoms or concerns  In addition to the time spent performing CPE, I spent an additional 25 minutes face to face,in which greater than 50% of this time was spent in counseling and coordination of care for patient's acute illness as documented, including the differential dx, treatment, further evaluation and other management of menorrhagia, iron defic anemia, Vit D deficiency, and anxiety

## 2017-05-15 NOTE — Patient Instructions (Addendum)
Please take the iron medication daily, as well as colace 100 mg twice per day OTC if needed, or Miralax OTC for constipation as well.    Please continue all other medications as before, and refills have been done if requested.  Please have the pharmacy call with any other refills you may need.  Please continue your efforts at being more active, low cholesterol diet, and weight control.  You are otherwise up to date with prevention measures today.  Please keep your appointments with your specialists as you may have planned  You will be contacted regarding the referral for: GYN  Please go to the LAB in the Basement (turn left off the elevator) for the tests to be done today  You will be contacted by phone if any changes need to be made immediately.  Otherwise, you will receive a letter about your results with an explanation, but please check with MyChart first.  Please remember to sign up for MyChart if you have not done so, as this will be important to you in the future with finding out test results, communicating by private email, and scheduling acute appointments online when needed.  Please return in 1 year for your yearly visit, or sooner if needed, with Lab testing done 3-5 days before

## 2017-05-15 NOTE — Progress Notes (Signed)
Subjective:    Patient ID: Ashley Christensen, female    DOB: 1971-03-23, 46 y.o.   MRN: [id number]  HPI  Here for wellness and f/u;  Overall doing ok;  Pt denies Chest pain, worsening SOB, DOE, wheezing, orthopnea, PND, worsening LE edema, palpitations, dizziness or syncope.  Pt denies neurological change such as new headache, facial or extremity weakness.  Pt denies polydipsia, polyuria, or low sugar symptoms. Pt states overall good compliance with treatment and medications, good tolerability, and has been trying to follow appropriate diet.  Pt denies worsening depressive symptoms, suicidal ideation or panic. No fever, night sweats, wt loss, loss of appetite, or other constitutional symptoms.  Pt states good ability with ADL's, has low fall risk, home safety reviewed and adequate, no other significant changes in hearing or vision, and only occasionally active with exercise  Has has more stress recently as college soon left college early and is living with her to find himself, has several episodes of near panic she cannot otherwise explain. Does c/o ongoing fatigue, but denies signficant daytime hypersomnolence, but still having significant fatigue with hx of iron def anemia and ongoing menorrhagia.  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  Also working part time at post [employer].  Due for GYN f/u , but needs [facility] G[facility] - [facility].  Has hx of menorrhagia with iron deficiency, ongoing as her previous GYN retired.  Does only take iron every other day when she remembers as it tends to cause   Did have recent UTI - Denies urinary symptoms such as dysuria, frequency, urgency, flank pain, hematuria or n/v, fever, chills.  No other new complaints or interval hx.  Past Medical History:  Diagnosis Date  . ANEMIA-IRON DEFICIENCY   . Anxiety    xanax prn  . DEPRESSION    hx of   Past Surgical History:  Procedure Laterality Date  .  BREAST CYST EXCISION     pt doesnt remember  . colonscopy    . left breast     lumpectomy left breast - cyst  . SVD     x 2  . TUBAL LIGATION      reports that  has never smoked. she has never used smokeless tobacco. She reports that she does not drink alcohol or use drugs. family history includes Breast cancer (age of onset: 51) in her maternal grandmother; Cancer in her maternal aunt and sister; Cancer (age of onset: 4) in her mother; Colon cancer in her sister. Allergies  Allergen Reactions  . Latex Hives and Itching  . Aspirin Hives    Pt avoids ibuprofen , usually takes tylenol  . Moxifloxacin Nausea And Vomiting   Current Outpatient Medications on File Prior to Visit  Medication Sig Dispense Refill  . albuterol (PROVENTIL HFA;VENTOLIN HFA) 108 (90 Base) MCG/ACT inhaler Inhale 2 puffs into the lungs every 6 (six) hours as needed for wheezing or shortness of breath. 18 g 11  . benzonatate (TESSALON) 100 MG capsule Take 1 capsule (100 mg total) by mouth 3 (three) times daily. 20 capsule 0  . Biotin 5000 MCG CAPS Take 1 capsule by mouth every morning.    . [DISCONTINUED] Fluticasone-Salmeterol (ADVAIR DISKUS IN) Inhale into the lungs.     No current facility-administered medications on file prior to visit.    Review of Systems Constitutional: Negative for other unusual diaphoresis, sweats, appetite or weight changes HENT: Negative  for other worsening hearing loss, ear pain, facial swelling, mouth sores or neck stiffness.   Eyes: Negative for other worsening pain, redness or other visual disturbance.  Respiratory: Negative for other stridor or swelling Cardiovascular: Negative for other palpitations or other chest pain  Gastrointestinal: Negative for worsening diarrhea or loose stools, blood in stool, distention or other pain Genitourinary: Negative for hematuria, flank pain or other change in urine volume.  Musculoskeletal: Negative for myalgias or other joint swelling.  Skin:  Negative for other color change, or other wound or worsening drainage.  Neurological: Negative for other syncope or numbness. Hematological: Negative for other adenopathy or swelling Psychiatric/Behavioral: Negative for hallucinations, other worsening agitation, SI, self-injury, or new decreased concentration All other system neg per pt    Objective:   Physical Exam BP 118/78   Pulse (!) 105   Temp 97.9 F (36.6 C) (Oral)   Ht 5\' 4"  (1.626 m)   Wt 106 lb (48.1 kg)   SpO2 98%   BMI 18.19 kg/m  VS noted,  Constitutional: Pt is oriented to person, place, and time. Appears well-developed and well-nourished, in no significant distress and comfortable Head: Normocephalic and atraumatic  Eyes: Conjunctivae and EOM are normal. Pupils are equal, round, and reactive to light Right Ear: External ear normal without discharge Left Ear: External ear normal without discharge Nose: Nose without discharge or deformity Mouth/Throat: Oropharynx is without other ulcerations and moist  Neck: Normal range of motion. Neck supple. No JVD present. No tracheal deviation present or significant neck LA or mass Cardiovascular: Normal rate, regular rhythm, normal heart sounds and intact distal pulses.   Pulmonary/Chest: WOB normal and breath sounds without rales or wheezing  Abdominal: Soft. Bowel sounds are normal. NT. No HSM  Musculoskeletal: Normal range of motion. Exhibits no edema Lymphadenopathy: Has no other cervical adenopathy.  Neurological: Pt is alert and oriented to person, place, and time. Pt has normal reflexes. No cranial nerve deficit. Motor grossly intact, Gait intact Skin: Skin is warm and dry. No rash noted or new ulcerations Psychiatric:  Has normal mood and affect. Behavior is normal without agitation No other exam findings Lab Results  Component Value Date   WBC 3.9 (L) 05/15/2017   HGB 10.7 (L) 05/15/2017   HCT 33.5 (L) 05/15/2017   PLT 448.0 (H) 05/15/2017   GLUCOSE 91 05/15/2017     CHOL 182 05/15/2017   TRIG 85.0 05/15/2017   HDL 57.10 05/15/2017   LDLCALC 108 (H) 05/15/2017   ALT 9 05/15/2017   AST 16 05/15/2017   NA 136 05/15/2017   K 3.6 05/15/2017   CL 103 05/15/2017   CREATININE 0.75 05/15/2017   BUN 16 05/15/2017   CO2 25 05/15/2017   TSH 1.46 05/15/2017   INR 1.1 ratio (H) 03/29/2010       Assessment & Plan:

## 2017-05-15 NOTE — Assessment & Plan Note (Signed)
D/w pt, declines other change in tx, declines counseling referral,  to f/u any worsening symptoms or concerns

## 2017-05-15 NOTE — Assessment & Plan Note (Signed)
Also for iron panel, to restart OTC oral iron, this time with colace and miralax prn,  to f/u any worsening symptoms or concerns

## 2017-05-16 ENCOUNTER — Telehealth: Payer: Self-pay

## 2017-05-16 LAB — HIV ANTIBODY (ROUTINE TESTING W REFLEX): HIV: NONREACTIVE

## 2017-05-16 MED FILL — VIT D2 1.25 MG (50,000 UNIT: 1.25 MG | 84 days supply | Qty: 12 | Fill #0

## 2017-05-16 NOTE — Telephone Encounter (Signed)
-----   Message from Biagio Borg, MD sent at 05/15/2017  7:27 PM EST ----- Left message on MyChart, pt to cont same tx except  The test results show that your current treatment is OK,except the iron level is low (though the Hgb is stable from last year), and the Vitamin D level is VERY low.  This is not a serious problem, but should be treated to help keep your overall bone health steady.  I will send a prescription for high dose Vit D at 50,000 units per week for 12 weeks, but then after that please take daily OTC Vitamin D3 2000 units indefinitely.  Shirron to please inform pt, I will do rx

## 2017-05-16 NOTE — Telephone Encounter (Signed)
Pt has viewed results via MyChart  

## 2017-06-03 DIAGNOSIS — Z1329 Encounter for screening for other suspected endocrine disorder: Secondary | ICD-10-CM | POA: Diagnosis not present

## 2017-06-03 DIAGNOSIS — N390 Urinary tract infection, site not specified: Secondary | ICD-10-CM | POA: Diagnosis not present

## 2017-06-03 DIAGNOSIS — Z780 Asymptomatic menopausal state: Secondary | ICD-10-CM | POA: Diagnosis not present

## 2017-06-03 DIAGNOSIS — Z01419 Encounter for gynecological examination (general) (routine) without abnormal findings: Secondary | ICD-10-CM | POA: Diagnosis not present

## 2017-06-03 DIAGNOSIS — Z113 Encounter for screening for infections with a predominantly sexual mode of transmission: Secondary | ICD-10-CM | POA: Diagnosis not present

## 2017-06-03 DIAGNOSIS — Z681 Body mass index (BMI) 19 or less, adult: Secondary | ICD-10-CM | POA: Diagnosis not present

## 2017-06-06 ENCOUNTER — Encounter: Payer: Self-pay | Admitting: Internal Medicine

## 2017-06-06 MED ORDER — POLYSACCHARIDE IRON COMPLEX 150 MG PO CAPS: 150.0000 mg | ORAL_CAPSULE | Freq: Every day | ORAL | 3 refills | Status: DC

## 2017-06-06 MED FILL — FERREX 150 CAPSULE: 150 | 90 days supply | Qty: 90 | Fill #0

## 2017-06-24 DIAGNOSIS — N921 Excessive and frequent menstruation with irregular cycle: Secondary | ICD-10-CM | POA: Diagnosis not present

## 2017-06-24 DIAGNOSIS — N926 Irregular menstruation, unspecified: Secondary | ICD-10-CM | POA: Diagnosis not present

## 2017-07-09 DIAGNOSIS — N939 Abnormal uterine and vaginal bleeding, unspecified: Secondary | ICD-10-CM | POA: Diagnosis not present

## 2017-07-16 DIAGNOSIS — H5213 Myopia, bilateral: Secondary | ICD-10-CM | POA: Diagnosis not present

## 2018-05-29 ENCOUNTER — Other Ambulatory Visit (INDEPENDENT_AMBULATORY_CARE_PROVIDER_SITE_OTHER): Payer: 59

## 2018-05-29 ENCOUNTER — Ambulatory Visit (INDEPENDENT_AMBULATORY_CARE_PROVIDER_SITE_OTHER): Payer: 59 | Admitting: Internal Medicine

## 2018-05-29 ENCOUNTER — Encounter: Payer: Self-pay | Admitting: Internal Medicine

## 2018-05-29 VITALS — BP 110/76 | HR 96 | Temp 97.9°F | Ht 64.0 in | Wt 107.0 lb

## 2018-05-29 DIAGNOSIS — Z0001 Encounter for general adult medical examination with abnormal findings: Secondary | ICD-10-CM

## 2018-05-29 DIAGNOSIS — F329 Major depressive disorder, single episode, unspecified: Secondary | ICD-10-CM

## 2018-05-29 DIAGNOSIS — F32A Depression, unspecified: Secondary | ICD-10-CM

## 2018-05-29 DIAGNOSIS — R6889 Other general symptoms and signs: Secondary | ICD-10-CM | POA: Insufficient documentation

## 2018-05-29 DIAGNOSIS — E785 Hyperlipidemia, unspecified: Secondary | ICD-10-CM | POA: Diagnosis not present

## 2018-05-29 DIAGNOSIS — D509 Iron deficiency anemia, unspecified: Secondary | ICD-10-CM

## 2018-05-29 DIAGNOSIS — E559 Vitamin D deficiency, unspecified: Secondary | ICD-10-CM

## 2018-05-29 LAB — CBC WITH DIFFERENTIAL/PLATELET
Basophils Absolute: 0.1 10*3/uL (ref 0.0–0.1)
Basophils Relative: 1.8 % (ref 0.0–3.0)
Eosinophils Absolute: 0.2 10*3/uL (ref 0.0–0.7)
Eosinophils Relative: 4.3 % (ref 0.0–5.0)
HCT: 34.2 % — ABNORMAL LOW (ref 36.0–46.0)
Hemoglobin: 11.3 g/dL — ABNORMAL LOW (ref 12.0–15.0)
Lymphocytes Relative: 38.3 % (ref 12.0–46.0)
Lymphs Abs: 1.6 10*3/uL (ref 0.7–4.0)
MCHC: 32.9 g/dL (ref 30.0–36.0)
MCV: 79.8 fl (ref 78.0–100.0)
Monocytes Absolute: 0.5 10*3/uL (ref 0.1–1.0)
Monocytes Relative: 12.2 % — ABNORMAL HIGH (ref 3.0–12.0)
Neutro Abs: 1.8 10*3/uL (ref 1.4–7.7)
Neutrophils Relative %: 43.4 % (ref 43.0–77.0)
Platelets: 317 10*3/uL (ref 150.0–400.0)
RBC: 4.29 Mil/uL (ref 3.87–5.11)
RDW: 16.3 % — ABNORMAL HIGH (ref 11.5–15.5)
WBC: 4.1 10*3/uL (ref 4.0–10.5)

## 2018-05-29 LAB — HEPATIC FUNCTION PANEL
ALBUMIN: 4.4 g/dL (ref 3.5–5.2)
ALK PHOS: 50 U/L (ref 39–117)
ALT: 12 U/L (ref 0–35)
AST: 17 U/L (ref 0–37)
Bilirubin, Direct: 0.2 mg/dL (ref 0.0–0.3)
Total Bilirubin: 1 mg/dL (ref 0.2–1.2)
Total Protein: 7.9 g/dL (ref 6.0–8.3)

## 2018-05-29 LAB — URINALYSIS, ROUTINE W REFLEX MICROSCOPIC
Nitrite: NEGATIVE
Urine Glucose: NEGATIVE
Urobilinogen, UA: 0.2 (ref 0.0–1.0)
pH: 6 (ref 5.0–8.0)

## 2018-05-29 LAB — IBC PANEL
Iron: 24 ug/dL — ABNORMAL LOW (ref 42–145)
Saturation Ratios: 5.5 % — ABNORMAL LOW (ref 20.0–50.0)
Transferrin: 311 mg/dL (ref 212.0–360.0)

## 2018-05-29 LAB — LIPID PANEL
Cholesterol: 183 mg/dL (ref 0–200)
HDL: 69.5 mg/dL (ref >39.00)
LDL Cholesterol: 99 mg/dL (ref 0–99)
NonHDL: 113.22
Total CHOL/HDL Ratio: 3
Triglycerides: 72 mg/dL (ref 0.0–149.0)
VLDL: 14.4 mg/dL (ref 0.0–40.0)

## 2018-05-29 LAB — VITAMIN D 25 HYDROXY (VIT D DEFICIENCY, FRACTURES): VITD: 36.85 ng/mL (ref 30.00–100.00)

## 2018-05-29 LAB — BASIC METABOLIC PANEL
BUN: 10 mg/dL (ref 6–23)
CO2: 26 mEq/L (ref 19–32)
Calcium: 9.6 mg/dL (ref 8.4–10.5)
Chloride: 102 mEq/L (ref 96–112)
Creatinine, Ser: 0.73 mg/dL (ref 0.40–1.20)
GFR: 108.75 mL/min (ref >60.00)
GLUCOSE: 92 mg/dL (ref 70–99)
Potassium: 3.6 mEq/L (ref 3.5–5.1)
Sodium: 137 mEq/L (ref 135–145)

## 2018-05-29 LAB — TSH: TSH: 0.73 u[IU]/mL (ref 0.35–4.50)

## 2018-05-29 MED ORDER — POLYSACCHARIDE IRON COMPLEX 150 MG PO CAPS: 150.0000 mg | ORAL_CAPSULE | Freq: Every day | ORAL | 3 refills | Status: DC

## 2018-05-29 MED ORDER — OSELTAMIVIR PHOSPHATE 75 MG PO CAPS: 75.0000 mg | ORAL_CAPSULE | Freq: Two times a day (BID) | ORAL | 0 refills | Status: DC

## 2018-05-29 MED FILL — FERREX 150 CAPSULE: 150 | 90 days supply | Qty: 90 | Fill #0

## 2018-05-29 MED FILL — OSELTAMIVIR PHOSPHATE 75 MG: 75 | 5 days supply | Qty: 10 | Fill #0

## 2018-05-29 NOTE — Patient Instructions (Signed)

## 2018-05-29 NOTE — Assessment & Plan Note (Signed)

## 2018-05-29 NOTE — Assessment & Plan Note (Addendum)
Mild to mod, for empiric tamiflu, pcr flu testing,  to f/u any worsening symptoms or concerns  In addition to the time spent performing CPE, I spent an additional 25 minutes face to face,in which greater than 50% of this time was spent in counseling and coordination of care for patient's acute illness as documented, including the differential dx, treatment, further evaluation and other management of flu like symptoms, vit d deficiency, iron def anemia, HLD, depression

## 2018-05-29 NOTE — Assessment & Plan Note (Signed)
stable overall by history and exam, recent data reviewed with pt, and pt to continue medical treatment as before,  to f/u any worsening symptoms or concerns  

## 2018-05-29 NOTE — Assessment & Plan Note (Signed)
Also for vit D lab f/u

## 2018-05-29 NOTE — Progress Notes (Signed)
Subjective:    Patient ID: Ashley Christensen, female    DOB: Nov 28, 1970, 48 y.o.   MRN: [id number]  HPI  Here for wellness and f/u;  Overall doing ok;  Pt denies Chest pain, worsening SOB, DOE, wheezing, orthopnea, PND, worsening LE edema, palpitations, dizziness or syncope.  Pt denies neurological change such as new headache, facial or extremity weakness.  Pt denies polydipsia, polyuria, or low sugar symptoms. Pt states overall good compliance with treatment and medications, good tolerability, and has been trying to follow appropriate diet.  Pt denies worsening depressive symptoms, suicidal ideation or panic. No fever, night sweats, wt loss, loss of appetite, or other constitutional symptoms.  Pt states good ability with ADL's, has low fall risk, home safety reviewed and adequate, no other significant changes in hearing or vision, and only occasionally active with exercise.  Graduating next yr from [employer] online with [place] in criminal justice, not sure what doing after that.  Still working as [facility] at [employer], now at 20 yrs with [employer].  Now has 4 published books, finishing one more to self publish on [employer].  Has younger son graduating from [employer] next spring, and older son now graudated from [employer] in [place], now back living at home at 21.   Did go to [place] last wed to visit family, now with URi symtpoms x 4 days, had to leave partial day of work jan 7, did not work yesterday.  Sister had been dx with influenza 4 days before the visit, and seemed better at the visit.  Rapid flu test was neg jan 7.  PCR not yet done.   Has known uterine fibroids but couldn't do the expensive shots, still having heavy periods but thinks less overall volume recently, trying to work on this on her down with better diet.  Taking Nu-iron - 1` per day, and managing constipation ok.    Did take the Vit D high dose rx last yr, needs f/u testing.  Past Medical History:  Diagnosis Date  . ANEMIA-IRON DEFICIENCY    . Anxiety    xanax prn  . DEPRESSION    hx of   Past Surgical History:  Procedure Laterality Date  . BREAST CYST EXCISION     pt doesnt remember  . colonscopy    . left breast     lumpectomy left breast - cyst  . SVD     x 2  . TUBAL LIGATION      reports that she has never smoked. She has never used smokeless tobacco. She reports that she does not drink alcohol or use drugs. family history includes Breast cancer (age of onset: 69) in her maternal grandmother; Cancer in her maternal aunt and sister; Cancer (age of onset: 49) in her mother; Colon cancer in her sister. Allergies  Allergen Reactions  . Latex Hives and Itching  . Aspirin Hives    Pt avoids ibuprofen , usually takes tylenol  . Moxifloxacin Nausea And Vomiting   Current Outpatient Medications on File Prior to Visit  Medication Sig Dispense Refill  . albuterol (PROVENTIL HFA;VENTOLIN HFA) 108 (90 Base) MCG/ACT inhaler Inhale 2 puffs into the lungs every 6 (six) hours as needed for wheezing or shortness of breath. 18 g 11  . ALPRAZolam (XANAX) 0.25 MG tablet Take 1 tablet (0.25 mg total) by mouth 2 (two) times daily as needed. for anxiety 60 tablet 2  . Biotin 5000 MCG CAPS Take 1 capsule by mouth every morning.    Marland Kitchen  pantoprazole (PROTONIX) 40 MG tablet Take 1 tablet (40 mg total) by mouth daily. 90 tablet 3  . Vitamin D, Ergocalciferol, (DRISDOL) 50000 units CAPS capsule Take 1 capsule (50,000 Units total) by mouth every 7 (seven) days. 12 capsule 0   No current facility-administered medications on file prior to visit.    Review of Systems Constitutional: Negative for other unusual diaphoresis, sweats, appetite or weight changes HENT: Negative for other worsening hearing loss, ear pain, facial swelling, mouth sores or neck stiffness.   Eyes: Negative for other worsening pain, redness or other visual disturbance.  Respiratory: Negative for other stridor or swelling Cardiovascular: Negative for other palpitations or  other chest pain  Gastrointestinal: Negative for worsening diarrhea or loose stools, blood in stool, distention or other pain Genitourinary: Negative for hematuria, flank pain or other change in urine volume.  Musculoskeletal: Negative for myalgias or other joint swelling.  Skin: Negative for other color change, or other wound or worsening drainage.  Neurological: Negative for other syncope or numbness. Hematological: Negative for other adenopathy or swelling Psychiatric/Behavioral: Negative for hallucinations, other worsening agitation, SI, self-injury, or new decreased concentration ALl other system neg per pt    Objective:   Physical Exam BP 110/76   Pulse 96   Temp 97.9 F (36.6 C) (Oral)   Ht 5\' 4"  (1.626 m)   Wt 107 lb (48.5 kg)   SpO2 99%   BMI 18.37 kg/m  VS noted,  Constitutional: Pt is oriented to person, place, and time. Appears well-developed and well-nourished, in no significant distress and comfortable Head: Normocephalic and atraumatic  Eyes: Conjunctivae and EOM are normal. Pupils are equal, round, and reactive to light Right Ear: External ear normal without discharge Left Ear: External ear normal without discharge Nose: Nose without discharge or deformity Mouth/Throat: Oropharynx is without other ulcerations and moist  Neck: Normal range of motion. Neck supple. No JVD present. No tracheal deviation present or significant neck LA or mass Cardiovascular: Normal rate, regular rhythm, normal heart sounds and intact distal pulses.   Pulmonary/Chest: WOB normal and breath sounds without rales or wheezing  Abdominal: Soft. Bowel sounds are normal. NT. No HSM  Musculoskeletal: Normal range of motion. Exhibits no edema Lymphadenopathy: Has no other cervical adenopathy.  Neurological: Pt is alert and oriented to person, place, and time. Pt has normal reflexes. No cranial nerve deficit. Motor grossly intact, Gait intact Skin: Skin is warm and dry. No rash noted or new  ulcerations Psychiatric:  Has normal mood and affect. Behavior is normal without agitation No other exam findings Lab Results  Component Value Date   WBC 3.9 (L) 05/15/2017   HGB 10.7 (L) 05/15/2017   HCT 33.5 (L) 05/15/2017   PLT 448.0 (H) 05/15/2017   GLUCOSE 91 05/15/2017   CHOL 182 05/15/2017   TRIG 85.0 05/15/2017   HDL 57.10 05/15/2017   LDLCALC 108 (H) 05/15/2017   ALT 9 05/15/2017   AST 16 05/15/2017   NA 136 05/15/2017   K 3.6 05/15/2017   CL 103 05/15/2017   CREATININE 0.75 05/15/2017   BUN 16 05/15/2017   CO2 25 05/15/2017   TSH 1.46 05/15/2017   INR 1.1 ratio (H) 03/29/2010       Assessment & Plan:

## 2018-05-29 NOTE — Assessment & Plan Note (Signed)
For f/u lab today, o/w stable overall by history and exam, recent data reviewed with pt, and pt to continue medical treatment as before,  to f/u any worsening symptoms or concerns  

## 2018-07-01 ENCOUNTER — Telehealth: Payer: 59 | Admitting: Physician Assistant

## 2018-07-01 ENCOUNTER — Encounter: Payer: Self-pay | Admitting: Internal Medicine

## 2018-07-01 DIAGNOSIS — R059 Cough, unspecified: Secondary | ICD-10-CM

## 2018-07-01 DIAGNOSIS — R05 Cough: Secondary | ICD-10-CM

## 2018-07-01 MED ORDER — ALBUTEROL SULFATE HFA 108 (90 BASE) MCG/ACT IN AERS: 2.0000 | INHALATION_SPRAY | Freq: Four times a day (QID) | RESPIRATORY_TRACT | 11 refills | Status: DC | PRN

## 2018-07-01 MED FILL — VENTOLIN HFA 90 MCG INHALER: 108 (90 BAS | 25 days supply | Qty: 18 | Fill #0

## 2018-07-01 NOTE — Progress Notes (Signed)
Concern for CHF vs. GERD vs. Infectious.    Based on what you shared with me it looks like you have a serious condition that should be evaluated in a face to face office visit.  NOTE: If you entered your credit card information for this eVisit, you will not be charged. You may see a "hold" on your card for the $30 but that hold will drop off and you will not have a charge processed.  If you are having a true medical emergency please call 911.  If you need an urgent face to face visit, [facility] has four urgent care centers for your convenience.  If you need care fast and have a high deductible or no insurance consider:   [email or web address] to reserve your spot online an avoid wait times  [facility] [address] [place], [place] [address] 8 am to 8 pm Monday-Friday 10 am to 4 pm Saturday-Sunday *Across the street from [employer]  [place], [address] 8 am to 5 pm Monday-Friday * In the [facility] on the [facility]   The following sites will take your  insurance:  . [facility] a Provider at this Location  [address] [place], [place] [address] . 10 am to 8 pm Monday-Friday . 12 pm to 8 pm Saturday-Sunday   . [facility] [place] a Provider at this Location  [place], [place], [place] [address] . 8 am to 8 pm Monday-Friday . 9 am to 6 pm Saturday . 11 am to 6 pm Sunday   . [facility] [place] Get Driving Directions  [address] [place], [place] [address] . 8 am to 8 pm Monday-Friday . 8 am to 4 pm Saturday-Sunday   Your e-visit answers were reviewed by a board certified advanced clinical practitioner to complete your personal care plan.  Thank you for using e-Visits.

## 2018-07-25 DIAGNOSIS — Z1509 Genetic susceptibility to other malignant neoplasm: Secondary | ICD-10-CM | POA: Insufficient documentation

## 2018-07-25 DIAGNOSIS — Z01419 Encounter for gynecological examination (general) (routine) without abnormal findings: Secondary | ICD-10-CM | POA: Diagnosis not present

## 2018-07-25 DIAGNOSIS — D259 Leiomyoma of uterus, unspecified: Secondary | ICD-10-CM | POA: Insufficient documentation

## 2018-07-25 DIAGNOSIS — Z681 Body mass index (BMI) 19 or less, adult: Secondary | ICD-10-CM | POA: Diagnosis not present

## 2018-07-28 ENCOUNTER — Other Ambulatory Visit: Payer: Self-pay | Admitting: Obstetrics and Gynecology

## 2018-07-28 DIAGNOSIS — N6310 Unspecified lump in the right breast, unspecified quadrant: Secondary | ICD-10-CM

## 2018-07-31 ENCOUNTER — Other Ambulatory Visit: Payer: 59

## 2018-10-30 MED FILL — TRANEXAMIC ACID 650 MG TAB: 650 | 5 days supply | Qty: 30 | Fill #0

## 2019-01-19 ENCOUNTER — Encounter: Payer: Self-pay | Admitting: Gastroenterology

## 2019-02-10 ENCOUNTER — Encounter: Payer: Self-pay | Admitting: Gastroenterology

## 2019-02-10 ENCOUNTER — Other Ambulatory Visit: Payer: Self-pay

## 2019-02-10 ENCOUNTER — Ambulatory Visit (AMBULATORY_SURGERY_CENTER): Payer: Self-pay | Admitting: *Deleted

## 2019-02-10 VITALS — Temp 97.1°F | Ht 64.0 in | Wt 111.0 lb

## 2019-02-10 DIAGNOSIS — H5213 Myopia, bilateral: Secondary | ICD-10-CM | POA: Diagnosis not present

## 2019-02-10 DIAGNOSIS — Z800 Family history of malignant neoplasm of digestive organs: Secondary | ICD-10-CM

## 2019-02-10 MED ORDER — SUPREP BOWEL PREP KIT 17.5-3.13-1.6 GM/177ML PO SOLN: 1.0000 | Freq: Once | ORAL | 0 refills | Status: AC

## 2019-02-10 MED FILL — SUPREP BOWEL PREP KIT: 17.5-3.13-1 | 2 days supply | Qty: 354 | Fill #0

## 2019-02-10 NOTE — Progress Notes (Signed)
No egg or soy allergy known to patient  No issues with past sedation with any surgeries  or procedures, no intubation problems  No diet pills per patient No home 02 use per patient  No blood thinners per patient  Pt denies issues with constipation - magnesium helps with loosening of stools and constipation is better- pt states goes daily to QOD and soft stools- some issues with bowel leakage at times  No A fib or A flutter  EMMI video sent to pt's e mail   Due to the COVID-19 pandemic we are asking patients to follow these guidelines. Please only bring one care partner. Please be aware that your care partner may wait in the car in the parking lot or if they feel like they will be too hot to wait in the car, they may wait in the lobby on the 4th floor. All care partners are required to wear a mask the entire time (we do not have any that we can provide them), they need to practice social distancing, and we will do a Covid check for all patient's and care partners when you arrive. Also we will check their temperature and your temperature. If the care partner waits in their car they need to stay in the parking lot the entire time and we will call them on their cell phone when the patient is ready for discharge so they can bring the car to the front of the building. Also all patient's will need to wear a mask into building.

## 2019-02-17 ENCOUNTER — Emergency Department (HOSPITAL_COMMUNITY): Payer: 59

## 2019-02-17 ENCOUNTER — Other Ambulatory Visit: Payer: Self-pay

## 2019-02-17 ENCOUNTER — Emergency Department (HOSPITAL_COMMUNITY)
Admission: EM | Admit: 2019-02-17 | Discharge: 2019-02-17 | Disposition: A | Discharge status: Home / Self Care | Payer: 59 | Attending: Emergency Medicine | Admitting: Emergency Medicine

## 2019-02-17 ENCOUNTER — Encounter (HOSPITAL_COMMUNITY): Payer: Self-pay | Admitting: Emergency Medicine

## 2019-02-17 DIAGNOSIS — Z91040 Latex allergy status: Secondary | ICD-10-CM | POA: Insufficient documentation

## 2019-02-17 DIAGNOSIS — Z79899 Other long term (current) drug therapy: Secondary | ICD-10-CM | POA: Insufficient documentation

## 2019-02-17 DIAGNOSIS — J45909 Unspecified asthma, uncomplicated: Secondary | ICD-10-CM | POA: Diagnosis not present

## 2019-02-17 DIAGNOSIS — M25511 Pain in right shoulder: Secondary | ICD-10-CM | POA: Diagnosis not present

## 2019-02-17 NOTE — ED Triage Notes (Signed)
Reports when getting ready this morning when putting shirt on over her head, heard her right shoulder pop. Having severe pains.

## 2019-02-17 NOTE — Discharge Instructions (Signed)
Use Tylenol Motrin for pain in your shoulder.  If pain is not improved follow-up with your primary care doctor as you may need physical therapy/MRI.

## 2019-02-17 NOTE — ED Provider Notes (Signed)
[facility] DEPT Provider Note   CSN: [id number] Arrival date & time: 02/17/19  0854     History   Chief Complaint Chief Complaint  Patient presents with  . Shoulder Pain    HPI Ashley Christensen is a 48 y.o. female.     The history is provided by the patient.  Shoulder Pain Location:  Shoulder Shoulder location:  R shoulder Injury: yes   Pain details:    Quality:  Aching   Radiates to:  Does not radiate   Severity:  Mild   Onset quality:  Gradual   Timing:  Intermittent   Progression:  Waxing and waning Relieved by:  Nothing Worsened by:  Nothing Associated symptoms: decreased range of motion   Associated symptoms: no back pain, no fever, no muscle weakness, no neck pain, no numbness, no swelling and no tingling     Past Medical History:  Diagnosis Date  . Allergy   . ANEMIA-IRON DEFICIENCY   . Anxiety    xanax prn  . Asthma   . DEPRESSION    hx of  . Family history of colon cancer    rectal cancer in sister age 41  . GERD (gastroesophageal reflux disease)     Patient Active Problem List   Diagnosis Date Noted  . 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 Uterine leiomyoma 07/25/2018  . Flu-like symptoms 05/29/2018  . HLD (hyperlipidemia) 05/29/2018  . Vitamin D deficiency 05/15/2017  . Menorrhagia 04/25/2016  . Insomnia 04/25/2016  . Diarrhea 04/25/2016  . Family history of malignant neoplasm of gastrointestinal tract 04/01/2014  . Eustachian tube dysfunction 04/01/2014  . Urinary frequency 07/15/2012  . Encounter for well adult exam with abnormal findings 07/15/2012  . CERVICAL RADICULOPATHY, RIGHT 09/07/2009  . Depression 02/17/2008  . WEIGHT LOSS 02/17/2008  . Iron deficiency anemia 07/28/2007  . Anxiety state 07/28/2007    Past Surgical History:  Procedure Laterality Date  . BREAST CYST EXCISION     pt doesnt remember  . COLONOSCOPY    . colonscopy    . left breast     lumpectomy left breast - cyst   . SVD     x 2  . TUBAL LIGATION       OB History    Gravida  2   Para  2   Term  2   Preterm      AB      Living  2     SAB      TAB      Ectopic      Multiple      Live Births               Home Medications    Prior to Admission medications   Medication Sig Start Date End Date Taking? Authorizing Provider  albuterol (PROVENTIL HFA;VENTOLIN HFA) 108 (90 Base) MCG/ACT inhaler Inhale 2 puffs into the lungs every 6 (six) hours as needed for wheezing or shortness of breath. 07/01/18   Biagio Borg, MD  ALPRAZolam Duanne Moron) 0.25 MG tablet Take 1 tablet (0.25 mg total) by mouth 2 (two) times daily as needed. for anxiety 05/15/17   Biagio Borg, MD  Ascorbic Acid (VITAMIN C) 1000 MG tablet Take 1,000 mg by mouth daily.    [provider]  Biotin 5000 MCG CAPS Take 1 capsule by mouth every morning.    [provider]  iron polysaccharides (NU-IRON) 150 MG capsule Take 1 capsule (150  mg total) by mouth daily. 05/29/18   Biagio Borg, MD  MAGNESIUM PO Take by mouth daily.    [provider]  Omeprazole (PRILOSEC PO) omeprazole    [provider]  oseltamivir (TAMIFLU) 75 MG capsule Take 1 capsule (75 mg total) by mouth 2 (two) times daily. 05/29/18   Biagio Borg, MD  pantoprazole (PROTONIX) 40 MG tablet Take 1 tablet (40 mg total) by mouth daily. 05/15/17   Biagio Borg, MD  tranexamic acid (LYSTEDA) 650 MG TABS tablet tranexamic acid 650 mg tablet    [provider]  Vitamin D, Ergocalciferol, (DRISDOL) 50000 units CAPS capsule Take 1 capsule (50,000 Units total) by mouth every 7 (seven) days. 05/15/17   Biagio Borg, MD    Family History Family History  Problem Relation Age of Onset  . Cancer Sister   . Colon cancer Sister 13  . Rectal cancer Sister   . Cancer Maternal Aunt   . Breast cancer Maternal Grandmother 30       died 48  . Colon polyps Neg Hx   . Esophageal cancer Neg Hx   . Stomach cancer Neg Hx      Social History Social History   Tobacco Use  . Smoking status: Never Smoker  . Smokeless tobacco: Never Used  Substance Use Topics  . Alcohol use: No  . Drug use: No     Allergies   Latex, Aspirin, Lactose intolerance (gi), and Moxifloxacin   Review of Systems Review of Systems  Constitutional: Negative for activity change, chills and fever.  Musculoskeletal: Positive for arthralgias. Negative for back pain, gait problem, joint swelling, neck pain and neck stiffness.  Skin: Negative for color change, pallor, rash and wound.     Physical Exam Updated Vital Signs  ED Triage Vitals  Enc Vitals Group     BP 02/17/19 0907 (!) 125/105     Pulse Rate 02/17/19 0907 85     Resp 02/17/19 0907 18     Temp 02/17/19 0907 97.9 F (36.6 C)     Temp Source 02/17/19 0907 Oral     SpO2 02/17/19 0907 100 %     Weight 02/17/19 0908 109 lb 1.6 oz (49.5 kg)     Height 02/17/19 0908 5\' 4"  (1.626 m)     Head Circumference --      Peak Flow --      Pain Score 02/17/19 0906 10     Pain Loc --      Pain Edu? --      Excl. in [place]? --     Physical Exam Constitutional:      General: She is not in acute distress.    Appearance: She is not ill-appearing.  Neck:     Musculoskeletal: Normal range of motion. No neck rigidity or muscular tenderness.  Musculoskeletal: Normal range of motion.        General: Tenderness (right shoulder) present.     Comments: No midline spinal pain   Skin:    General: Skin is warm and dry.     Capillary Refill: Capillary refill takes less than 2 seconds.  Neurological:     Mental Status: She is alert.      ED Treatments / Results  Labs (all labs ordered are listed, but only abnormal results are displayed) Labs Reviewed - No data to display  EKG None  Radiology Dg Shoulder Right  Result Date: 02/17/2019 CLINICAL DATA:  Popping sensation in shoulder with  persistent pain, initial encounter EXAM: RIGHT SHOULDER - 2+ VIEW COMPARISON:  None. FINDINGS:  There is no evidence of fracture or dislocation. There is no evidence of arthropathy or other focal bone abnormality. Soft tissues are unremarkable. IMPRESSION: No acute abnormality noted. Electronically Signed   By: Inez Catalina M.D.   On: 02/17/2019 09:33    Procedures Procedures (including critical care time)  Medications Ordered in ED Medications - No data to display   Initial Impression / Assessment and Plan / ED Course  I have reviewed the triage vital signs and the nursing notes.  Pertinent labs & imaging results that were available during my care of the patient were reviewed by me and considered in my medical decision making (see chart for details).        OLENA ROLLER is a 48 year old female who presents the ED with right shoulder pain.  Patient with normal vitals.  No fever.  Patient was putting on some close this morning and felt some pain in her right shoulder.  X-ray showed no fracture, no dislocation.  Has good strength and sensation in the right upper extremity but does have some tenderness and pain with range of motion at the right shoulder.  Possibly mild rotator cuff injury.  Has good sensation and good pulses in the right upper extremity.  No neck pain.  Will give a sling for comfort.  Recommend Motrin and Tylenol for pain.  Recommend follow-up with primary care doctor and discharged in ED in good condition.  This chart was dictated using voice recognition software.  Despite best efforts to proofread,  errors can occur which can change the documentation meaning.    Final Clinical Impressions(s) / ED Diagnoses   Final diagnoses:  Acute pain of right shoulder    ED Discharge Orders    None       Lennice Sites, DO 02/17/19 1039

## 2019-02-24 ENCOUNTER — Encounter: Payer: Self-pay | Admitting: Gastroenterology

## 2019-02-24 ENCOUNTER — Ambulatory Visit (AMBULATORY_SURGERY_CENTER): Payer: 59 | Admitting: Gastroenterology

## 2019-02-24 ENCOUNTER — Other Ambulatory Visit: Payer: Self-pay

## 2019-02-24 VITALS — BP 91/73 | HR 62 | Temp 97.6°F | Resp 14 | Ht 64.0 in | Wt 111.0 lb

## 2019-02-24 DIAGNOSIS — Z1211 Encounter for screening for malignant neoplasm of colon: Secondary | ICD-10-CM | POA: Diagnosis not present

## 2019-02-24 DIAGNOSIS — K219 Gastro-esophageal reflux disease without esophagitis: Secondary | ICD-10-CM | POA: Diagnosis not present

## 2019-02-24 DIAGNOSIS — Z800 Family history of malignant neoplasm of digestive organs: Secondary | ICD-10-CM | POA: Diagnosis not present

## 2019-02-24 DIAGNOSIS — J45909 Unspecified asthma, uncomplicated: Secondary | ICD-10-CM | POA: Diagnosis not present

## 2019-02-24 MED ORDER — SODIUM CHLORIDE 0.9 % IV SOLN: 500.0000 mL | Freq: Once | INTRAVENOUS | Status: DC

## 2019-02-24 NOTE — Patient Instructions (Signed)
YOU HAD AN ENDOSCOPIC PROCEDURE TODAY AT THE [facility]:   Refer to the procedure report that was given to you for any specific questions about what was found during the examination.  If the procedure report does not answer your questions, please call your gastroenterologist to clarify.  If you requested that your care partner not be given the details of your procedure findings, then the procedure report has been included in a sealed envelope for you to review at your convenience later.  YOU SHOULD EXPECT: Some feelings of bloating in the abdomen. Passage of more gas than usual.  Walking can help get rid of the air that was put into your GI tract during the procedure and reduce the bloating. If you had a lower endoscopy (such as a colonoscopy or flexible sigmoidoscopy) you may notice spotting of blood in your stool or on the toilet paper. If you underwent a bowel prep for your procedure, you may not have a normal bowel movement for a few days.  Please Note:  You might notice some irritation and congestion in your nose or some drainage.  This is from the oxygen used during your procedure.  There is no need for concern and it should clear up in a day or so.  SYMPTOMS TO REPORT IMMEDIATELY:   Following lower endoscopy (colonoscopy or flexible sigmoidoscopy):  Excessive amounts of blood in the stool  Significant tenderness or worsening of abdominal pains  Swelling of the abdomen that is new, acute  Fever of 100F or higher  For urgent or emergent issues, a gastroenterologist can be reached at any hour by calling [phone].   DIET:  We do recommend a small meal at first, but then you may proceed to your regular diet.  Drink plenty of fluids but you should avoid alcoholic beverages for 24 hours.  ACTIVITY:  You should plan to take it easy for the rest of today and you should NOT DRIVE or use heavy machinery until tomorrow (because of the sedation medicines used during the test).     FOLLOW UP: Our staff will call the number listed on your records 48-72 hours following your procedure to check on you and address any questions or concerns that you may have regarding the information given to you following your procedure. If we do not reach you, we will leave a message.  We will attempt to reach you two times.  During this call, we will ask if you have developed any symptoms of COVID 19. If you develop any symptoms (ie: fever, flu-like symptoms, shortness of breath, cough etc.) before then, please call [phone].  If you test positive for Covid 19 in the 2 weeks post procedure, please call and report this information to us.    If any biopsies were taken you will be contacted by phone or by letter within the next 1-3 weeks.  Please call us at [phone] if you have not heard about the biopsies in 3 weeks.    SIGNATURES/CONFIDENTIALITY: You and/or your care partner have signed paperwork which will be entered into your electronic medical record.  These signatures attest to the fact that that the information above on your After Visit Summary has been reviewed and is understood.  Full responsibility of the confidentiality of this discharge information lies with you and/or your care-partner. 

## 2019-02-24 NOTE — Progress Notes (Signed)
Report to PACU, RN, vss, BBS= Clear.  

## 2019-02-24 NOTE — Op Note (Signed)
[facility] Patient Name: Ashley Christensen Procedure Date: 02/24/2019 7:34 AM MRN: [id number] Endoscopist: Milus Banister , MD Age: 48 Referring MD:  Date of Birth: 1971-01-18 Gender: Female Account #: [id number] Procedure:                Colonoscopy Indications:              Screening in patient at increased risk: Colorectal                            cancer in sister in her 45s; colonoscopy 2009 was                            normal Medicines:                Monitored Anesthesia Care Procedure:                Pre-Anesthesia Assessment:                           - Prior to the procedure, a History and Physical                            was performed, and patient medications and                            allergies were reviewed. The patient's tolerance of                            previous anesthesia was also reviewed. The risks                            and benefits of the procedure and the sedation                            options and risks were discussed with the patient.                            All questions were answered, and informed consent                            was obtained. Prior Anticoagulants: The patient has                            taken no previous anticoagulant or antiplatelet                            agents. ASA Grade Assessment: II - A patient with                            mild systemic disease. After reviewing the risks                            and benefits, the patient was deemed in  satisfactory condition to undergo the procedure.                           After obtaining informed consent, the colonoscope                            was passed under direct vision. Throughout the                            procedure, the patient's blood pressure, pulse, and                            oxygen saturations were monitored continuously. The                            Colonoscope was introduced through the anus and                             advanced to the the cecum, identified by                            appendiceal orifice and ileocecal valve. The                            colonoscopy was performed without difficulty. The                            patient tolerated the procedure well. The quality                            of the bowel preparation was good. Scope In: 8:00:57 AM Scope Out: 8:14:28 AM Scope Withdrawal Time: 0 hours 9 minutes 52 seconds  Total Procedure Duration: 0 hours 13 minutes 31 seconds  Findings:                 The entire examined colon appeared normal on direct                            and retroflexion views. Complications:            No immediate complications. Estimated blood loss:                            None. Estimated Blood Loss:     Estimated blood loss: none. Impression:               - The entire examined colon is normal on direct and                            retroflexion views.                           - No polyps or cancers. Recommendation:           - Patient has a contact number available for  emergencies. The signs and symptoms of potential                            delayed complications were discussed with the                            patient. Return to normal activities tomorrow.                            Written discharge instructions were provided to the                            patient.                           - Resume previous diet.                           - Continue present medications.                           - Repeat colonoscopy in 5 years for screening. Milus Banister, MD 02/24/2019 8:16:44 AM This report has been signed electronically.

## 2019-02-24 NOTE — Progress Notes (Signed)
Pt's states no medical or surgical changes since previsit or office visit.  JB - temp CW - vitals. 

## 2019-02-26 ENCOUNTER — Telehealth: Payer: Self-pay

## 2019-02-26 ENCOUNTER — Telehealth: Payer: Self-pay | Admitting: *Deleted

## 2019-02-26 NOTE — Telephone Encounter (Signed)
Second follow up call attempt.  Message left on vm. 

## 2019-02-26 NOTE — Telephone Encounter (Signed)
LVM

## 2019-06-02 ENCOUNTER — Encounter: Payer: 59 | Admitting: Internal Medicine

## 2019-06-03 ENCOUNTER — Telehealth: Payer: 59 | Admitting: Nurse Practitioner

## 2019-06-03 DIAGNOSIS — R6889 Other general symptoms and signs: Secondary | ICD-10-CM | POA: Diagnosis not present

## 2019-06-03 MED ORDER — OSELTAMIVIR PHOSPHATE 75 MG PO CAPS: 75.0000 mg | ORAL_CAPSULE | Freq: Two times a day (BID) | ORAL | 0 refills | Status: DC

## 2019-06-03 MED FILL — OSELTAMIVIR PHOSPHATE 75 MG: 75 | 5 days supply | Qty: 10 | Fill #0

## 2019-06-03 NOTE — Progress Notes (Signed)
E visit for Flu like symptoms   We are sorry that you are not feeling well.  Here is how we plan to help! Based on what you have shared with me it looks like you may have a respiratory virus that may be influenza.   * I have written you an out of work note that is in your my chart.  Influenza or "the flu" is   an infection caused by a respiratory virus. The flu virus is highly contagious and persons who did not receive their yearly flu vaccination may "catch" the flu from close contact.  We have anti-viral medications to treat the viruses that cause this infection. They are not a "cure" and only shorten the course of the infection. These prescriptions are most effective when they are given within the first 2 days of "flu" symptoms. Antiviral medication are indicated if you have a high risk of complications from the flu. You should  also consider an antiviral medication if you are in close contact with someone who is at risk. These medications can help patients avoid complications from the flu  but have side effects that you should know. Possible side effects from Tamiflu or oseltamivir include nausea, vomiting, diarrhea, dizziness, headaches, eye redness, sleep problems or other respiratory symptoms. You should not take Tamiflu if you have an allergy to oseltamivir or any to the ingredients in Tamiflu.  Based upon your symptoms and potential risk factors I have prescribed Oseltamivir (Tamiflu).  It has been sent to your designated pharmacy.  You will take one 75 mg capsule orally twice a day for the next 5 days.  ANYONE WHO HAS FLU SYMPTOMS SHOULD: . Stay home. The flu is highly contagious and going out or to work exposes others! . Be sure to drink plenty of fluids. Water is fine as well as fruit juices, sodas and electrolyte beverages. You may want to stay away from caffeine or alcohol. If you are nauseated, try taking small sips of liquids. How do you know if you are getting enough fluid? Your  urine should be a pale yellow or almost colorless. . Get rest. . Taking a steamy shower or using a humidifier may help nasal congestion and ease sore throat pain. Using a saline nasal spray works much the same way. . Cough drops, hard candies and sore throat lozenges may ease your cough. . Line up a caregiver. Have someone check on you regularly.   GET HELP RIGHT AWAY IF: . You cannot keep down liquids or your medications. . You become short of breath . Your fell like you are going to pass out or loose consciousness. . Your symptoms persist after you have completed your treatment plan MAKE SURE YOU   Understand these instructions.  Will watch your condition.  Will get help right away if you are not doing well or get worse.  Your e-visit answers were reviewed by a board certified advanced clinical practitioner to complete your personal care plan.  Depending on the condition, your plan could have included both over the counter or prescription medications.  If there is a problem please reply  once you have received a response from your provider.  Your safety is important to [place].  If you have drug allergies check your prescription carefully.    You can use MyChart to ask questions about today's visit, request a non-urgent call back, or ask for a work or school excuse for 24 hours related to this e-Visit. If it has been  greater than 24 hours you will need to follow up with your provider, or enter a new e-Visit to address those concerns.  You will get an e-mail in the next two days asking about your experience.  I hope that your e-visit has been valuable and will speed your recovery. Thank you for using e-visits.  5-10 minutes spent reviewing and documenting in chart.

## 2019-06-24 ENCOUNTER — Other Ambulatory Visit: Payer: Self-pay

## 2019-06-24 ENCOUNTER — Ambulatory Visit (INDEPENDENT_AMBULATORY_CARE_PROVIDER_SITE_OTHER): Payer: 59 | Admitting: Internal Medicine

## 2019-06-24 ENCOUNTER — Encounter: Payer: Self-pay | Admitting: Internal Medicine

## 2019-06-24 VITALS — BP 108/76 | Temp 98.6°F | Ht 64.0 in | Wt 110.6 lb

## 2019-06-24 DIAGNOSIS — D509 Iron deficiency anemia, unspecified: Secondary | ICD-10-CM

## 2019-06-24 DIAGNOSIS — E559 Vitamin D deficiency, unspecified: Secondary | ICD-10-CM

## 2019-06-24 DIAGNOSIS — Z0001 Encounter for general adult medical examination with abnormal findings: Secondary | ICD-10-CM | POA: Diagnosis not present

## 2019-06-24 DIAGNOSIS — F411 Generalized anxiety disorder: Secondary | ICD-10-CM

## 2019-06-24 DIAGNOSIS — K219 Gastro-esophageal reflux disease without esophagitis: Secondary | ICD-10-CM | POA: Diagnosis not present

## 2019-06-24 DIAGNOSIS — E538 Deficiency of other specified B group vitamins: Secondary | ICD-10-CM | POA: Diagnosis not present

## 2019-06-24 DIAGNOSIS — F329 Major depressive disorder, single episode, unspecified: Secondary | ICD-10-CM | POA: Diagnosis not present

## 2019-06-24 DIAGNOSIS — F32A Depression, unspecified: Secondary | ICD-10-CM

## 2019-06-24 DIAGNOSIS — Z0000 Encounter for general adult medical examination without abnormal findings: Secondary | ICD-10-CM

## 2019-06-24 LAB — CBC WITH DIFFERENTIAL/PLATELET
Basophils Absolute: 0 10*3/uL (ref 0.0–0.1)
Basophils Relative: 0.4 % (ref 0.0–3.0)
Eosinophils Absolute: 0.1 10*3/uL (ref 0.0–0.7)
Eosinophils Relative: 1.6 % (ref 0.0–5.0)
HCT: 36 % (ref 36.0–46.0)
Hemoglobin: 11.8 g/dL — ABNORMAL LOW (ref 12.0–15.0)
Lymphocytes Relative: 48.3 % — ABNORMAL HIGH (ref 12.0–46.0)
Lymphs Abs: 2.3 10*3/uL (ref 0.7–4.0)
MCHC: 32.8 g/dL (ref 30.0–36.0)
MCV: 84.6 fl (ref 78.0–100.0)
Monocytes Absolute: 0.3 10*3/uL (ref 0.1–1.0)
Monocytes Relative: 6.8 % (ref 3.0–12.0)
Neutro Abs: 2.1 10*3/uL (ref 1.4–7.7)
Neutrophils Relative %: 42.9 % — ABNORMAL LOW (ref 43.0–77.0)
Platelets: 343 10*3/uL (ref 150.0–400.0)
RBC: 4.25 Mil/uL (ref 3.87–5.11)
RDW: 15.2 % (ref 11.5–15.5)
WBC: 4.8 10*3/uL (ref 4.0–10.5)

## 2019-06-24 LAB — BASIC METABOLIC PANEL
BUN: 8 mg/dL (ref 6–23)
CO2: 28 mEq/L (ref 19–32)
Calcium: 9.9 mg/dL (ref 8.4–10.5)
Chloride: 100 mEq/L (ref 96–112)
Creatinine, Ser: 0.68 mg/dL (ref 0.40–1.20)
GFR: 110.57 mL/min (ref >60.00)
Glucose, Bld: 86 mg/dL (ref 70–99)
Potassium: 3.8 mEq/L (ref 3.5–5.1)
Sodium: 136 mEq/L (ref 135–145)

## 2019-06-24 LAB — HEPATIC FUNCTION PANEL
ALT: 16 U/L (ref 0–35)
AST: 21 U/L (ref 0–37)
Albumin: 4.5 g/dL (ref 3.5–5.2)
Alkaline Phosphatase: 57 U/L (ref 39–117)
Bilirubin, Direct: 0.2 mg/dL (ref 0.0–0.3)
Total Bilirubin: 1.4 mg/dL — ABNORMAL HIGH (ref 0.2–1.2)
Total Protein: 8 g/dL (ref 6.0–8.3)

## 2019-06-24 LAB — VITAMIN B12: Vitamin B-12: 757 pg/mL (ref 211–911)

## 2019-06-24 LAB — LIPID PANEL
Cholesterol: 190 mg/dL (ref 0–200)
HDL: 67.6 mg/dL (ref >39.00)
LDL Cholesterol: 108 mg/dL — ABNORMAL HIGH (ref 0–99)
NonHDL: 122.03
Total CHOL/HDL Ratio: 3
Triglycerides: 70 mg/dL (ref 0.0–149.0)
VLDL: 14 mg/dL (ref 0.0–40.0)

## 2019-06-24 LAB — IBC PANEL
Iron: 59 ug/dL (ref 42–145)
Saturation Ratios: 14.6 % — ABNORMAL LOW (ref 20.0–50.0)
Transferrin: 289 mg/dL (ref 212.0–360.0)

## 2019-06-24 LAB — TSH: TSH: 1.52 u[IU]/mL (ref 0.35–4.50)

## 2019-06-24 LAB — VITAMIN D 25 HYDROXY (VIT D DEFICIENCY, FRACTURES): VITD: 14.76 ng/mL — ABNORMAL LOW (ref 30.00–100.00)

## 2019-06-24 MED ORDER — ALPRAZOLAM 0.25 MG PO TABS: 0.2500 mg | ORAL_TABLET | Freq: Two times a day (BID) | ORAL | 2 refills | Status: DC | PRN

## 2019-06-24 MED ORDER — PANTOPRAZOLE SODIUM 40 MG PO TBEC: 40.0000 mg | DELAYED_RELEASE_TABLET | Freq: Two times a day (BID) | ORAL | 3 refills | Status: DC

## 2019-06-24 MED ORDER — POLYSACCHARIDE IRON COMPLEX 150 MG PO CAPS: 150.0000 mg | ORAL_CAPSULE | Freq: Every day | ORAL | 3 refills | Status: AC

## 2019-06-24 MED FILL — ALPRAZolam 0.25 MG TABS: 0.25 | 30 days supply | Qty: 60 | Fill #0

## 2019-06-24 MED FILL — PANTOPRAZOLE SOD DR 40 MG T: 40 | 90 days supply | Qty: 180 | Fill #0

## 2019-06-24 MED FILL — FERREX 150 CAPSULE: 150 | 90 days supply | Qty: 90 | Fill #0

## 2019-06-24 NOTE — Assessment & Plan Note (Signed)
stable overall by history and exam, recent data reviewed with pt, and pt to continue medical treatment as before,  to f/u any worsening symptoms or concerns  

## 2019-06-24 NOTE — Progress Notes (Signed)
Subjective:    Patient ID: Ashley Christensen, female    DOB: Sep 22, 1970, 49 y.o.   MRN: [id number]  HPI  Here for wellness and f/u;  Overall doing ok;  Pt denies Chest pain, worsening SOB, DOE, wheezing, orthopnea, PND, worsening LE edema, palpitations, dizziness or syncope.  Pt denies neurological change such as new headache, facial or extremity weakness.  Pt denies polydipsia, polyuria, or low sugar symptoms. Pt states overall good compliance with treatment and medications, good tolerability, and has been trying to follow appropriate diet.  Pt denies worsening depressive symptoms, suicidal ideation or panic. No fever, night sweats, wt loss, loss of appetite, or other constitutional symptoms.  Pt states good ability with ADL's, has low fall risk, home safety reviewed and adequate, no other significant changes in hearing or vision, and only occasionally active with exercise. Also, c/o worsening reflux, but no abd pain, dysphagia, n/v, bowel change or blood. Past Medical History:  Diagnosis Date  . Allergy   . ANEMIA-IRON DEFICIENCY   . Anxiety    xanax prn  . Asthma   . DEPRESSION    hx of  . Family history of colon cancer    rectal cancer in sister age 39  . GERD (gastroesophageal reflux disease)    Past Surgical History:  Procedure Laterality Date  . BREAST CYST EXCISION     pt doesnt remember  . COLONOSCOPY    . colonscopy    . left breast     lumpectomy left breast - cyst  . SVD     x 2  . TUBAL LIGATION      reports that she has never smoked. She has never used smokeless tobacco. She reports that she does not drink alcohol or use drugs. family history includes Breast cancer (age of onset: 60) in her maternal grandmother; Cancer in her maternal aunt and sister; Colon cancer (age of onset: 38) in her sister; Rectal cancer in her sister. Allergies  Allergen Reactions  . Latex Hives and Itching  . Aspirin Hives    Pt avoids ibuprofen , usually takes tylenol  . Lactose  Intolerance (Gi)   . Moxifloxacin Nausea And Vomiting   Current Outpatient Medications on File Prior to Visit  Medication Sig Dispense Refill  . albuterol (PROVENTIL HFA;VENTOLIN HFA) 108 (90 Base) MCG/ACT inhaler Inhale 2 puffs into the lungs every 6 (six) hours as needed for wheezing or shortness of breath. 18 g 11  . Ascorbic Acid (VITAMIN C) 1000 MG tablet Take 1,000 mg by mouth daily.    . Biotin 5000 MCG CAPS Take 1 capsule by mouth every morning.    Marland Kitchen MAGNESIUM PO Take by mouth daily.    Marland Kitchen oseltamivir (TAMIFLU) 75 MG capsule Take 1 capsule (75 mg total) by mouth 2 (two) times daily. (Patient not taking: Reported on 06/24/2019) 10 capsule 0  . tranexamic acid (LYSTEDA) 650 MG TABS tablet tranexamic acid 650 mg tablet     No current facility-administered medications on file prior to visit.   Review of Systems All otherwise neg per pt     Objective:   Physical Exam BP 108/76 (BP Location: Left Arm, Patient Position: Sitting, Cuff Size: Normal)   Temp 98.6 F (37 C)   Ht 5\' 4"  (1.626 m)   Wt 110 lb 9.6 oz (50.2 kg)   LMP 05/24/2018   BMI 18.98 kg/m  VS noted,  Constitutional: Pt appears in NAD HENT: Head: NCAT.  Right Ear: External ear normal.  Left Ear: External ear normal.  Eyes: . Pupils are equal, round, and reactive to light. Conjunctivae and EOM are normal Nose: without d/c or deformity Neck: Neck supple. Gross normal ROM Cardiovascular: Normal rate and regular rhythm.   Pulmonary/Chest: Effort normal and breath sounds without rales or wheezing.  Abd:  Soft, NT, ND, + BS, no organomegaly Neurological: Pt is alert. At baseline orientation, motor grossly intact Skin: Skin is warm. No rashes, other new lesions, no LE edema Psychiatric: Pt behavior is normal without agitation  All otherwise neg per pt Lab Results  Component Value Date   WBC 4.1 05/29/2018   HGB 11.3 (L) 05/29/2018   HCT 34.2 (L) 05/29/2018   PLT 317.0 05/29/2018   GLUCOSE 92 05/29/2018   CHOL 183  05/29/2018   TRIG 72.0 05/29/2018   HDL 69.50 05/29/2018   LDLCALC 99 05/29/2018   ALT 12 05/29/2018   AST 17 05/29/2018   NA 137 05/29/2018   K 3.6 05/29/2018   CL 102 05/29/2018   CREATININE 0.73 05/29/2018   BUN 10 05/29/2018   CO2 26 05/29/2018   TSH 0.73 05/29/2018   INR 1.1 ratio (H) 03/29/2010         Assessment & Plan:

## 2019-06-24 NOTE — Assessment & Plan Note (Signed)
For contd xanax prn

## 2019-06-24 NOTE — Assessment & Plan Note (Signed)
Cont oral replacement 

## 2019-06-24 NOTE — Assessment & Plan Note (Signed)

## 2019-06-24 NOTE — Patient Instructions (Signed)
Ok to increase the protonix to twice per day  You will be contacted regarding the referral for: Gastroenterology  Please continue all other medications as before, and refills have been done if requested including the iron  Please have the pharmacy call with any other refills you may need.  Please continue your efforts at being more active, low cholesterol diet, and weight control.  You are otherwise up to date with prevention measures today.  Please keep your appointments with your specialists as you may have planned  Please go to the LAB at the blood drawing area for the tests to be done  You will be contacted by phone if any changes need to be made immediately.  Otherwise, you will receive a letter about your results with an explanation, but please check with MyChart first.  Please remember to sign up for MyChart if you have not done so, as this will be important to you in the future with finding out test results, communicating by private email, and scheduling acute appointments online when needed.  Please make an Appointment to return for your 1 year visit, or sooner if needed

## 2019-06-24 NOTE — Assessment & Plan Note (Addendum)
Etiology ucnlaer, for iron, refer GI  I spent 31 minutes preparing to see the patient by review of recent labs, imaging and procedures, obtaining and reviewing separately obtained history, communicating with the patient and family or caregiver, ordering medications, tests or procedures, and documenting clinical information in the EHR including the differential Dx, treatment, and any further evaluation and other management of iron def anemia, GERD, vit d def, depression, anxiety

## 2019-06-24 NOTE — Assessment & Plan Note (Signed)
Uncontrolled, for increased protonix bid

## 2019-06-25 ENCOUNTER — Other Ambulatory Visit: Payer: Self-pay | Admitting: Internal Medicine

## 2019-06-25 LAB — URINALYSIS, ROUTINE W REFLEX MICROSCOPIC
Bilirubin Urine: NEGATIVE
Hgb urine dipstick: NEGATIVE
Ketones, ur: 15 — AB
Leukocytes,Ua: NEGATIVE
Nitrite: POSITIVE — AB
RBC / HPF: NONE SEEN (ref 0–?)
Specific Gravity, Urine: >=1.03 — AB (ref 1.000–1.030)
Total Protein, Urine: NEGATIVE
Urine Glucose: NEGATIVE
Urobilinogen, UA: 0.2 (ref 0.0–1.0)
pH: 6 (ref 5.0–8.0)

## 2019-06-25 MED ORDER — VITAMIN D (ERGOCALCIFEROL) 1.25 MG (50000 UNIT) PO CAPS: 50000.0000 [IU] | ORAL_CAPSULE | ORAL | 0 refills | Status: AC

## 2019-06-25 MED FILL — VIT D2 1.25 MG (50,000 UNIT: 1.25 MG | 84 days supply | Qty: 12 | Fill #0

## 2019-07-29 ENCOUNTER — Ambulatory Visit: Payer: 59 | Admitting: Gastroenterology

## 2019-07-29 ENCOUNTER — Ambulatory Visit
Admission: RE | Admit: 2019-07-29 | Discharge: 2019-07-29 | Disposition: A | Discharge status: Home / Self Care | Payer: 59 | Source: Ambulatory Visit | Attending: Obstetrics and Gynecology | Admitting: Obstetrics and Gynecology

## 2019-07-29 ENCOUNTER — Encounter: Payer: Self-pay | Admitting: Gastroenterology

## 2019-07-29 ENCOUNTER — Other Ambulatory Visit: Payer: Self-pay

## 2019-07-29 VITALS — BP 120/82 | HR 58 | Ht 64.0 in | Wt 107.4 lb

## 2019-07-29 DIAGNOSIS — N6489 Other specified disorders of breast: Secondary | ICD-10-CM | POA: Diagnosis not present

## 2019-07-29 DIAGNOSIS — D509 Iron deficiency anemia, unspecified: Secondary | ICD-10-CM

## 2019-07-29 DIAGNOSIS — R922 Inconclusive mammogram: Secondary | ICD-10-CM | POA: Diagnosis not present

## 2019-07-29 DIAGNOSIS — N6310 Unspecified lump in the right breast, unspecified quadrant: Secondary | ICD-10-CM

## 2019-07-29 NOTE — Progress Notes (Signed)
Review of pertinent gastrointestinal problems: 1.  Family history of colon cancer : I did a colonoscopy for her October 2020.  Given her family history of colon cancer, her sister had colon cancer in her 22s.  The colonoscopy was completely normal and I recommended repeat examination at 5-year interval.   HPI: This is a very pleasant 49 year old woman who was referred to me by Biagio Borg, MD  to evaluate iron deficiency anemia.    Chief complaint is iron deficiency anemia  She was referred today for iron deficiency anemia.  Looking back in her lab records it appears that she has had iron deficiency anemia at least dating back 6 or 7 years.  CBC August 2015 showed a hemoglobin of 9.9, MCV of 76.  Her ferritin level at that time was 5.9, total iron 21.  "Celiac panel" December 2017 was normal.  This included TTG, Gleaton IgG, antigliadin IgA.  It does not look like it included total IgA level.  She tells me that she has very heavy periods.  This has been going on for 7 or 8 years now.  She will go through 24 pads throughout her cycle having to change them 3 or 4 times a day.   She has no overt GI bleeding.  She does intermittently have dark stools, likely from her oral iron.  She has no overt hematemesis.  She does not have any significant abdominal pains.  She does have mild dyspepsia.  Her weight is overall stable    Review of systems: Pertinent positive and negative review of systems were noted in the above HPI section. All other review negative.   Past Medical History:  Diagnosis Date  . Allergy   . ANEMIA-IRON DEFICIENCY   . Anxiety    xanax prn  . Asthma   . DEPRESSION    hx of  . Family history of colon cancer    rectal cancer in sister age 74  . GERD (gastroesophageal reflux disease)     Past Surgical History:  Procedure Laterality Date  . BREAST CYST EXCISION     pt doesnt remember  . COLONOSCOPY    . colonscopy    . left breast     lumpectomy left breast -  cyst  . SVD     x 2  . TUBAL LIGATION      Current Outpatient Medications  Medication Sig Dispense Refill  . albuterol (PROVENTIL HFA;VENTOLIN HFA) 108 (90 Base) MCG/ACT inhaler Inhale 2 puffs into the lungs every 6 (six) hours as needed for wheezing or shortness of breath. 18 g 11  . ALPRAZolam (XANAX) 0.25 MG tablet Take 1 tablet (0.25 mg total) by mouth 2 (two) times daily as needed. for anxiety 60 tablet 2  . Ascorbic Acid (VITAMIN C) 1000 MG tablet Take 1,000 mg by mouth daily.    . Biotin 5000 MCG CAPS Take 1 capsule by mouth every morning.    . iron polysaccharides (NU-IRON) 150 MG capsule Take 1 capsule (150 mg total) by mouth daily. 90 capsule 3  . MAGNESIUM PO Take by mouth daily.    . pantoprazole (PROTONIX) 40 MG tablet Take 1 tablet (40 mg total) by mouth 2 (two) times daily. 180 tablet 3  . Probiotic Product (PROBIOTIC DAILY) CAPS Take 1 capsule by mouth daily.    . tranexamic acid (LYSTEDA) 650 MG TABS tablet tranexamic acid 650 mg tablet    . Vitamin D, Ergocalciferol, (DRISDOL) 1.25 MG (50000 UNIT) CAPS  capsule Take 1 capsule (50,000 Units total) by mouth every 7 (seven) days. 12 capsule 0   No current facility-administered medications for this visit.    Allergies as of 07/29/2019 - Review Complete 07/29/2019  Allergen Reaction Noted  . Latex Hives and Itching 04/24/2011  . Aspirin Hives 02/19/2008  . Lactose intolerance (gi)  02/10/2019  . Moxifloxacin Nausea And Vomiting 07/28/2007    Family History  Problem Relation Age of Onset  . Cancer Sister   . Colon cancer Sister 32  . Rectal cancer Sister   . Cancer Maternal Aunt   . Breast cancer Maternal Grandmother 30       died 67  . Colon polyps Neg Hx   . Esophageal cancer Neg Hx   . Stomach cancer Neg Hx     Social History   Socioeconomic History  . Marital status: Single    Spouse name: Not on file  . Number of children: Not on file  . Years of education: Not on file  . Highest education level: Not  on file  Occupational History  . Not on file  Tobacco Use  . Smoking status: Never Smoker  . Smokeless tobacco: Never Used  Substance and Sexual Activity  . Alcohol use: No  . Drug use: No  . Sexual activity: Yes    Birth control/protection: Surgical  Other Topics Concern  . Not on file  Social History Narrative  . Not on file   Social Determinants of Health   Financial Resource Strain:   . Difficulty of Paying Living Expenses: Not on file  Food Insecurity:   . Worried About Charity fundraiser in the Last Year: Not on file  . Ran Out of Food in the Last Year: Not on file  Transportation Needs:   . Lack of Transportation (Medical): Not on file  . Lack of Transportation (Non-Medical): Not on file  Physical Activity:   . Days of Exercise per Week: Not on file  . Minutes of Exercise per Session: Not on file  Stress:   . Feeling of Stress : Not on file  Social Connections:   . Frequency of Communication with Friends and Family: Not on file  . Frequency of Social Gatherings with Friends and Family: Not on file  . Attends Religious Services: Not on file  . Active Member of Clubs or Organizations: Not on file  . Attends Archivist Meetings: Not on file  . Marital Status: Not on file  Intimate Partner Violence:   . Fear of Current or Ex-Partner: Not on file  . Emotionally Abused: Not on file  . Physically Abused: Not on file  . Sexually Abused: Not on file     Physical Exam: BP 120/82 (BP Location: Left Arm, Patient Position: Sitting, Cuff Size: Normal)   Pulse (!) 58   Ht 5\' 4"  (1.626 m)   Wt 107 lb 6 oz (48.7 kg)   SpO2 100%   BMI 18.43 kg/m  Constitutional: generally well-appearing Psychiatric: alert and oriented x3 Eyes: extraocular movements intact Mouth: oral pharynx moist, no lesions Neck: supple no lymphadenopathy Cardiovascular: heart regular rate and rhythm Lungs: clear to auscultation bilaterally Abdomen: soft, nontender, nondistended, no  obvious ascites, no peritoneal signs, normal bowel sounds Extremities: no lower extremity edema bilaterally Skin: no lesions on visible extremities   Assessment and plan: 49 y.o. female with iron deficiency anemia  She has been having very heavy menstrual cycles for 7 or 8 years.  She does  not have any significant GI symptoms which would make me suspicious for a GI issue contributing to her iron deficiency.  She is going to be talking with her gynecologist about possible hysterectomy.  She knows she has fibroids in her uterus.  I do not think she needs any dedicated gastrointestinal testing at this point but she knows to follow-up with me on an as-needed basis.   Please see the "Patient Instructions" section for addition details about the plan.   Owens Loffler, MD [facility] 07/29/2019, 9:17 AM  Cc: Biagio Borg, MD  Total time on date of encounter was 45  minutes (this included time spent preparing to see the patient reviewing records; obtaining and/or reviewing separately obtained history; performing a medically appropriate exam and/or evaluation; counseling and educating the patient and family if present; ordering medications, tests or procedures if applicable; and documenting clinical information in the health record).

## 2019-07-29 NOTE — Patient Instructions (Signed)
If you are age 51 or older, your body mass index should be between 23-30. Your Body mass index is 18.43 kg/m. If this is out of the aforementioned range listed, please consider follow up with your Primary Care Provider.  If you are age 19 or younger, your body mass index should be between 19-25. Your Body mass index is 18.43 kg/m. If this is out of the aformentioned range listed, please consider follow up with your Primary Care Provider.   You will follow up with our office if symptoms worsen or fail to improve.  Thank you for entrusting me with your care and choosing [facility].  Dr Ardis Hughs

## 2019-08-17 DIAGNOSIS — Z681 Body mass index (BMI) 19 or less, adult: Secondary | ICD-10-CM | POA: Diagnosis not present

## 2019-08-17 DIAGNOSIS — D259 Leiomyoma of uterus, unspecified: Secondary | ICD-10-CM | POA: Diagnosis not present

## 2019-08-17 DIAGNOSIS — N6310 Unspecified lump in the right breast, unspecified quadrant: Secondary | ICD-10-CM | POA: Diagnosis not present

## 2019-08-17 DIAGNOSIS — R319 Hematuria, unspecified: Secondary | ICD-10-CM | POA: Diagnosis not present

## 2019-08-17 DIAGNOSIS — Z01419 Encounter for gynecological examination (general) (routine) without abnormal findings: Secondary | ICD-10-CM | POA: Diagnosis not present

## 2019-08-24 MED FILL — SULFAMETHOXAZOLE-TMP DS TAB: 800-160 | 7 days supply | Qty: 14 | Fill #0

## 2019-12-18 MED FILL — ALPRAZolam 0.25 MG TABS: 0.25 | 30 days supply | Qty: 60 | Fill #1

## 2020-02-10 DIAGNOSIS — N920 Excessive and frequent menstruation with regular cycle: Secondary | ICD-10-CM | POA: Diagnosis not present

## 2020-02-10 DIAGNOSIS — N924 Excessive bleeding in the premenopausal period: Secondary | ICD-10-CM | POA: Diagnosis not present

## 2020-02-10 MED FILL — TRANEXAMIC ACID 650 MG TABS: 650 | 5 days supply | Qty: 30 | Fill #0

## 2020-04-21 IMAGING — MG DIGITAL DIAGNOSTIC BILAT W/ TOMO W/ CAD
8 series · 9 of 24 positions shown · non-contrast
Comparison: Previous exam(s).

CLINICAL DATA: The patient's physician palpated an abnormality in
the lateral aspect of the right breast. The patient states that she
does not feel a palpable mass.

EXAM:
DIGITAL DIAGNOSTIC BILATERAL MAMMOGRAM WITH CAD AND TOMO
ULTRASOUND RIGHT BREAST

[L MLO synth-2D]
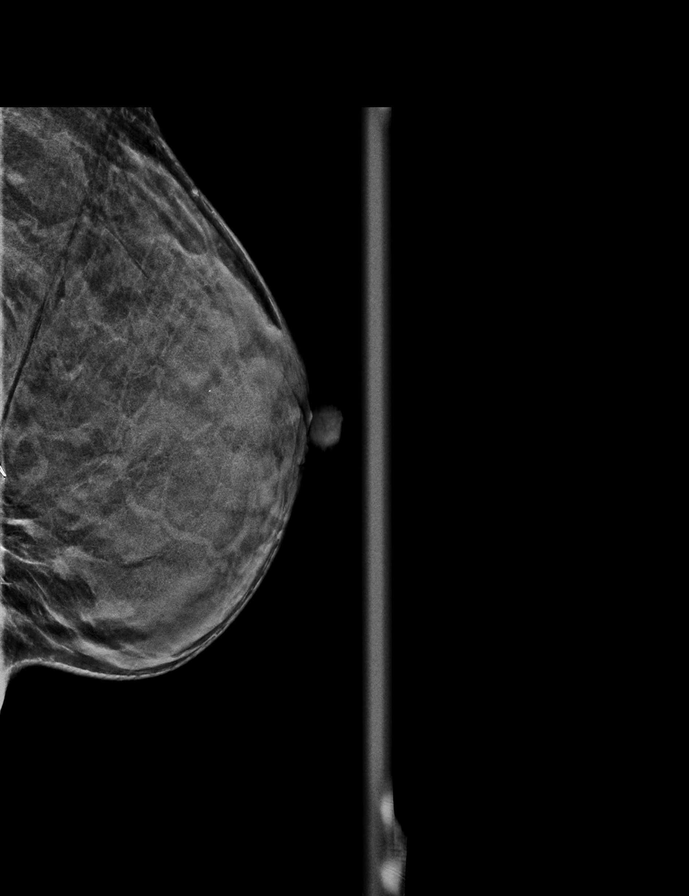

[L CC synth-2D]
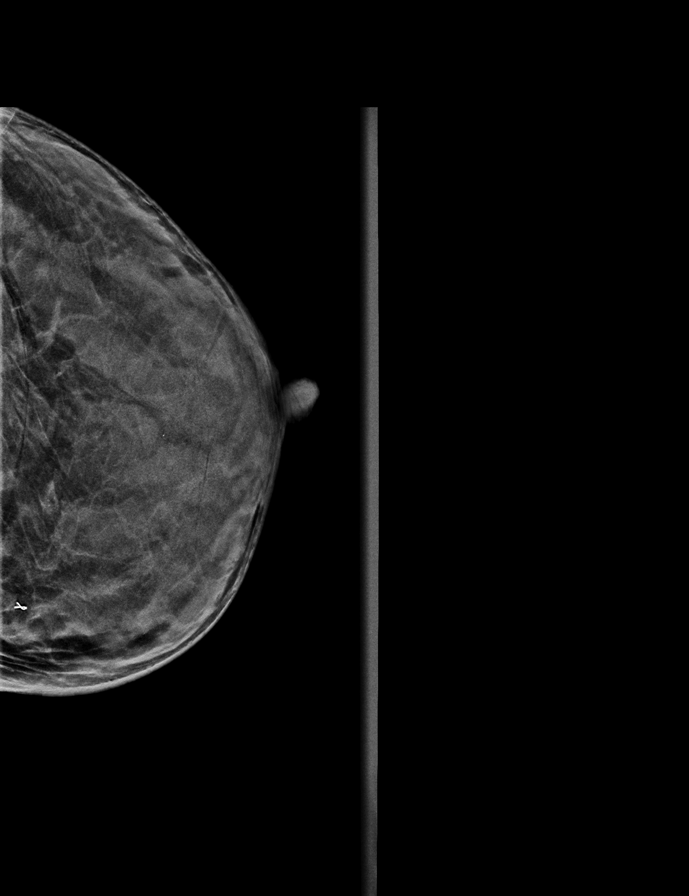

[R MLO synth-2D]
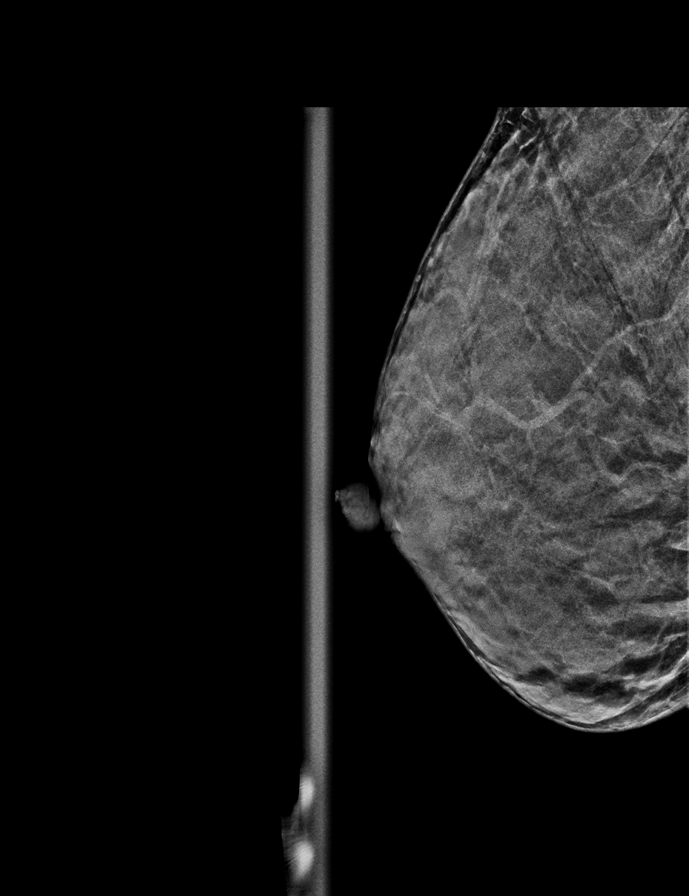

[R CC synth-2D]
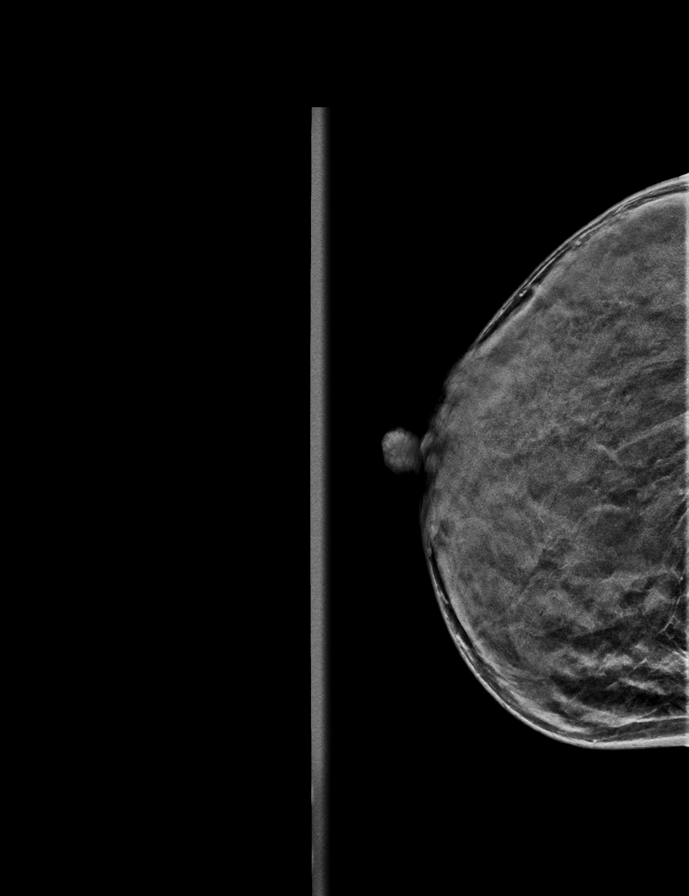

[L CC tomo · 2 of 42 frames shown]
[frame 14/42]
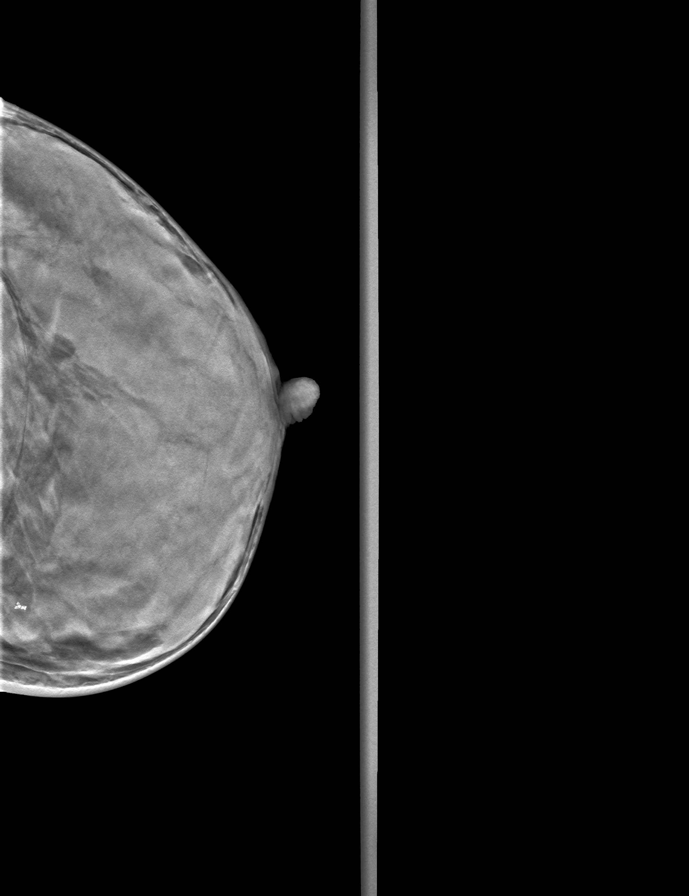
[frame 21/42]
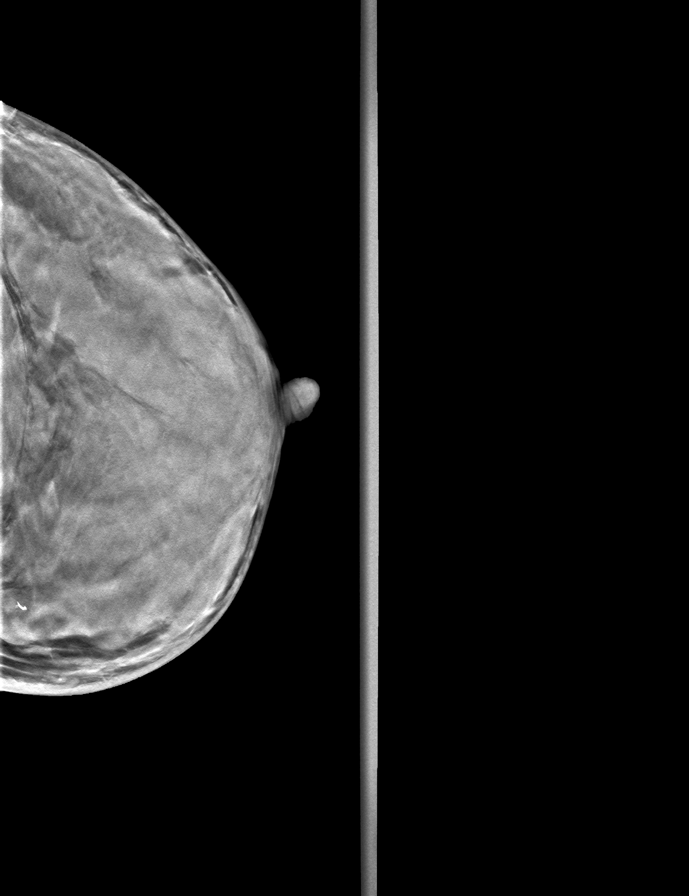

[R CC tomo · tomo slice 19/38.0]
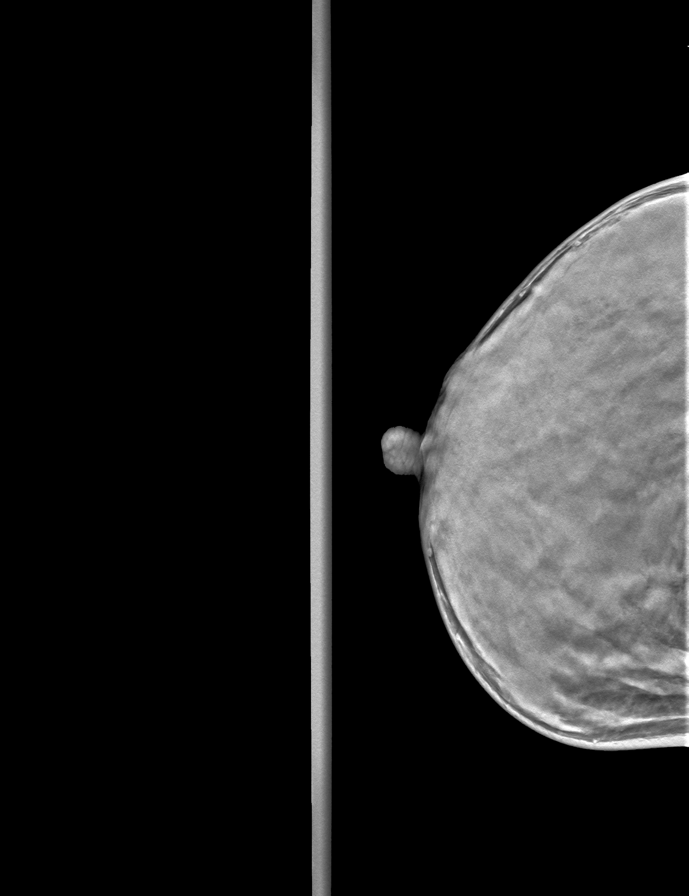

[R MLO tomo · tomo slice 19/37.0]
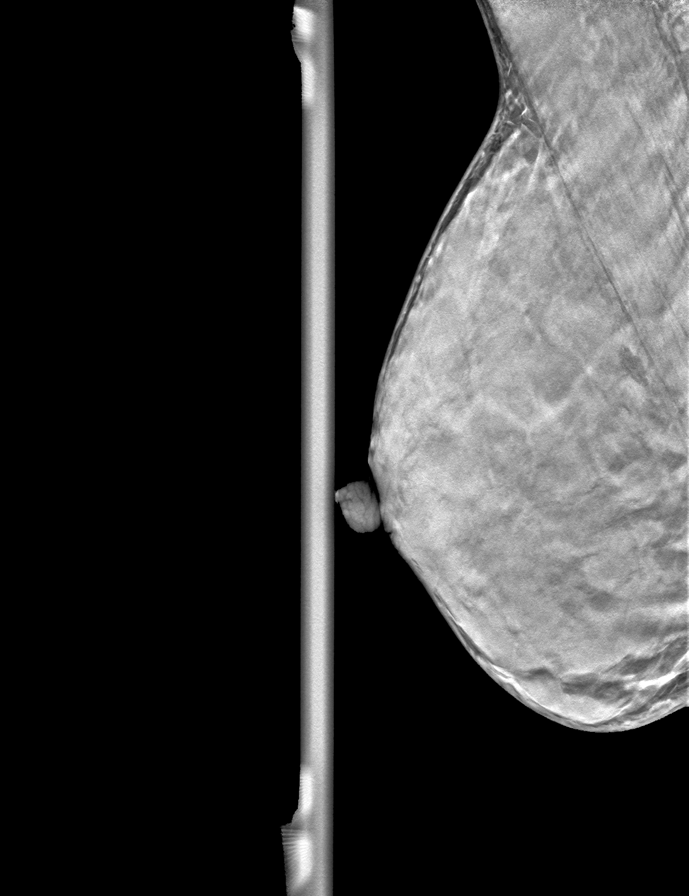

[L MLO tomo · tomo slice 21/42.0]
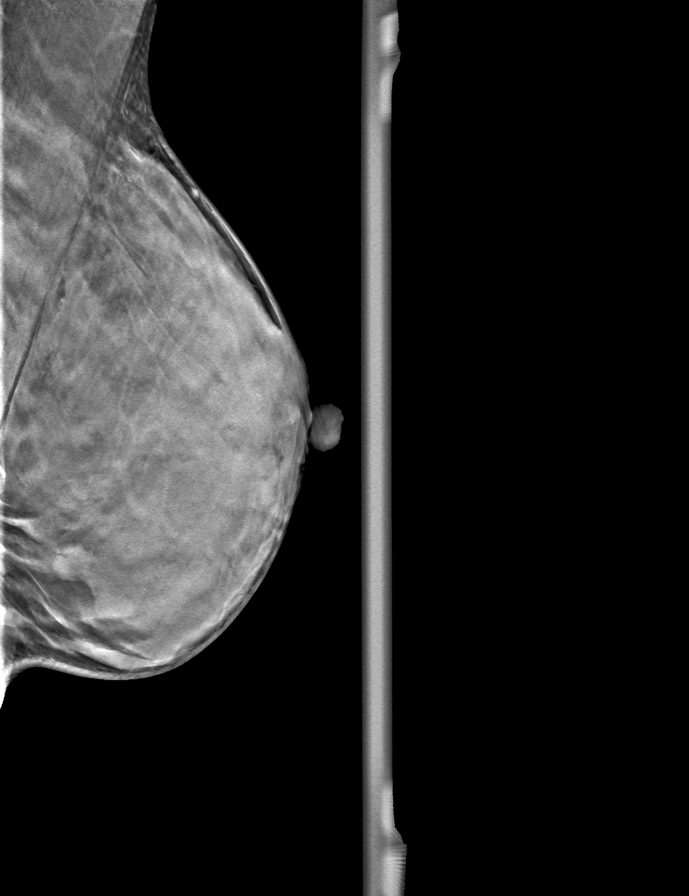

[9 of 24 positions shown; findings below may reference images not displayed]

ACR Breast Density Category d: The breast tissue is extremely dense,
which lowers the sensitivity of mammography.
FINDINGS: No suspicious mass, malignant type microcalcifications or distortion
detected in either breast.

Mammographic images were processed with CAD.

On physical exam, I do not palpate a mass in the lateral aspect the
right breast.

Targeted ultrasound is performed, showing normal tissue throughout
the lateral aspect of the right breast. No solid or cystic mass,
abnormal shadowing or distortion visualized.
IMPRESSION: No evidence of malignancy in either breast.

RECOMMENDATION:
Bilateral screening mammogram in 1 year is recommended.

I have discussed the findings and recommendations with the patient.
If applicable, a reminder letter will be sent to the patient
regarding the next appointment.

BI-RADS CATEGORY  1: Negative.

## 2020-06-10 DIAGNOSIS — H5213 Myopia, bilateral: Secondary | ICD-10-CM | POA: Diagnosis not present

## 2020-08-29 ENCOUNTER — Other Ambulatory Visit (HOSPITAL_COMMUNITY): Payer: Self-pay

## 2020-08-29 DIAGNOSIS — Z681 Body mass index (BMI) 19 or less, adult: Secondary | ICD-10-CM | POA: Diagnosis not present

## 2020-08-29 DIAGNOSIS — Z780 Asymptomatic menopausal state: Secondary | ICD-10-CM | POA: Diagnosis not present

## 2020-08-29 DIAGNOSIS — D259 Leiomyoma of uterus, unspecified: Secondary | ICD-10-CM | POA: Diagnosis not present

## 2020-08-29 DIAGNOSIS — Z01419 Encounter for gynecological examination (general) (routine) without abnormal findings: Secondary | ICD-10-CM | POA: Diagnosis not present

## 2020-08-29 DIAGNOSIS — Z309 Encounter for contraceptive management, unspecified: Secondary | ICD-10-CM | POA: Diagnosis not present

## 2020-08-29 DIAGNOSIS — N6310 Unspecified lump in the right breast, unspecified quadrant: Secondary | ICD-10-CM | POA: Diagnosis not present

## 2020-08-29 MED ORDER — GABAPENTIN 300 MG PO CAPS
ORAL_CAPSULE | ORAL | 3 refills | Status: DC
  Filled 2020-08-29: qty 90, 90d supply, fill #0
  Filled 2021-02-15: qty 90, 90d supply, fill #1

## 2020-08-29 MED ORDER — TRANEXAMIC ACID 650 MG PO TABS
ORAL_TABLET | ORAL | 0 refills | Status: DC
  Filled 2020-08-29: qty 30, 5d supply, fill #0

## 2020-09-01 ENCOUNTER — Encounter: Payer: Self-pay | Admitting: Internal Medicine

## 2020-09-01 ENCOUNTER — Other Ambulatory Visit (HOSPITAL_COMMUNITY): Payer: Self-pay

## 2020-09-01 ENCOUNTER — Ambulatory Visit (INDEPENDENT_AMBULATORY_CARE_PROVIDER_SITE_OTHER): Payer: 59 | Admitting: Internal Medicine

## 2020-09-01 ENCOUNTER — Other Ambulatory Visit: Payer: Self-pay

## 2020-09-01 VITALS — BP 118/74 | HR 73 | Temp 98.1°F | Ht 64.0 in | Wt 108.0 lb

## 2020-09-01 DIAGNOSIS — E559 Vitamin D deficiency, unspecified: Secondary | ICD-10-CM

## 2020-09-01 DIAGNOSIS — E785 Hyperlipidemia, unspecified: Secondary | ICD-10-CM

## 2020-09-01 DIAGNOSIS — D509 Iron deficiency anemia, unspecified: Secondary | ICD-10-CM | POA: Diagnosis not present

## 2020-09-01 DIAGNOSIS — Z1159 Encounter for screening for other viral diseases: Secondary | ICD-10-CM

## 2020-09-01 DIAGNOSIS — Z0000 Encounter for general adult medical examination without abnormal findings: Secondary | ICD-10-CM | POA: Diagnosis not present

## 2020-09-01 DIAGNOSIS — E538 Deficiency of other specified B group vitamins: Secondary | ICD-10-CM

## 2020-09-01 DIAGNOSIS — Z0001 Encounter for general adult medical examination with abnormal findings: Secondary | ICD-10-CM

## 2020-09-01 LAB — HEPATIC FUNCTION PANEL
ALT: 17 U/L (ref 0–35)
AST: 24 U/L (ref 0–37)
Albumin: 4.2 g/dL (ref 3.5–5.2)
Alkaline Phosphatase: 65 U/L (ref 39–117)
Bilirubin, Direct: 0.2 mg/dL (ref 0.0–0.3)
Total Bilirubin: 0.9 mg/dL (ref 0.2–1.2)
Total Protein: 7.8 g/dL (ref 6.0–8.3)

## 2020-09-01 LAB — CBC WITH DIFFERENTIAL/PLATELET
Basophils Absolute: 0 10*3/uL (ref 0.0–0.1)
Basophils Relative: 0.7 % (ref 0.0–3.0)
Eosinophils Absolute: 0.1 10*3/uL (ref 0.0–0.7)
Eosinophils Relative: 1.7 % (ref 0.0–5.0)
HCT: 36.9 % (ref 36.0–46.0)
Hemoglobin: 12.2 g/dL (ref 12.0–15.0)
Lymphocytes Relative: 40.7 % (ref 12.0–46.0)
Lymphs Abs: 2.1 10*3/uL (ref 0.7–4.0)
MCHC: 33 g/dL (ref 30.0–36.0)
MCV: 87.6 fl (ref 78.0–100.0)
Monocytes Absolute: 0.3 10*3/uL (ref 0.1–1.0)
Monocytes Relative: 5.4 % (ref 3.0–12.0)
Neutro Abs: 2.7 10*3/uL (ref 1.4–7.7)
Neutrophils Relative %: 51.5 % (ref 43.0–77.0)
Platelets: 296 10*3/uL (ref 150.0–400.0)
RBC: 4.22 Mil/uL (ref 3.87–5.11)
RDW: 13.2 % (ref 11.5–15.5)
WBC: 5.2 10*3/uL (ref 4.0–10.5)

## 2020-09-01 LAB — BASIC METABOLIC PANEL
BUN: 11 mg/dL (ref 6–23)
CO2: 30 mEq/L (ref 19–32)
Calcium: 10.1 mg/dL (ref 8.4–10.5)
Chloride: 102 mEq/L (ref 96–112)
Creatinine, Ser: 0.79 mg/dL (ref 0.40–1.20)
GFR: 86.41 mL/min (ref >60.00)
Glucose, Bld: 93 mg/dL (ref 70–99)
Potassium: 4.4 mEq/L (ref 3.5–5.1)
Sodium: 138 mEq/L (ref 135–145)

## 2020-09-01 LAB — TSH: TSH: 1.06 u[IU]/mL (ref 0.35–4.50)

## 2020-09-01 LAB — URINALYSIS, ROUTINE W REFLEX MICROSCOPIC
Bilirubin Urine: NEGATIVE
Ketones, ur: NEGATIVE
Leukocytes,Ua: NEGATIVE
Nitrite: NEGATIVE
Specific Gravity, Urine: 1.02 (ref 1.000–1.030)
Total Protein, Urine: NEGATIVE
Urine Glucose: NEGATIVE
Urobilinogen, UA: 0.2 (ref 0.0–1.0)
WBC, UA: NONE SEEN (ref 0–?)
pH: 6 (ref 5.0–8.0)

## 2020-09-01 LAB — LIPID PANEL
Cholesterol: 174 mg/dL (ref 0–200)
HDL: 67.9 mg/dL (ref >39.00)
LDL Cholesterol: 90 mg/dL (ref 0–99)
NonHDL: 106.23
Total CHOL/HDL Ratio: 3
Triglycerides: 81 mg/dL (ref 0.0–149.0)
VLDL: 16.2 mg/dL (ref 0.0–40.0)

## 2020-09-01 LAB — VITAMIN D 25 HYDROXY (VIT D DEFICIENCY, FRACTURES): VITD: 24.99 ng/mL — ABNORMAL LOW (ref 30.00–100.00)

## 2020-09-01 LAB — VITAMIN B12: Vitamin B-12: 932 pg/mL — ABNORMAL HIGH (ref 211–911)

## 2020-09-01 MED ORDER — PANTOPRAZOLE SODIUM 40 MG PO TBEC
40.0000 mg | DELAYED_RELEASE_TABLET | Freq: Two times a day (BID) | ORAL | 3 refills | Status: AC
  Filled 2020-09-01: qty 180, 90d supply, fill #0
  Filled 2021-02-15: qty 180, 90d supply, fill #1

## 2020-09-01 MED ORDER — ALPRAZOLAM 0.25 MG PO TABS
0.2500 mg | ORAL_TABLET | Freq: Two times a day (BID) | ORAL | 2 refills | Status: AC | PRN
  Filled 2020-09-01: qty 60, 30d supply, fill #0
  Filled 2021-02-15: qty 60, 30d supply, fill #1

## 2020-09-01 NOTE — Progress Notes (Signed)
Patient ID: Ashley Christensen, female   DOB: May 26, 1970, 50 y.o.   MRN: [id number]         Chief Complaint:: wellness exam        HPI:  Ashley Christensen is a 50 y.o. female here for wellness exam; due for hep c screen and mammogram and plans to schedule this herself;                        Also recently started apr 11 on gabapentin 300 qhs per GYN for nightly hot flashes and working so far.  Did have recent left should strain significant 2 mo ago now much improved with ibuprofen.  Has significant issue with sleeping, a chronic issue, many years, has significant stressors, father died with covid infection 19-Jan-2020.  Parents are [place].  Mother still alive. Taking MVI with iron, recent iron per GYN was ok per pt, almost cut out all red meat.  Now taking vit d 1000 u qd.  Trying to follow low chol diet.  Pt believes she is menopausal with hair thinning. No other new complaints   Wt Readings from Last 3 Encounters:  09/01/20 108 lb (49 kg)  07/29/19 107 lb 6 oz (48.7 kg)  06/24/19 110 lb 9.6 oz (50.2 kg)   BP Readings from Last 3 Encounters:  09/01/20 118/74  07/29/19 120/82  06/24/19 108/76   Immunization History  Administered Date(s) Administered  . Influenza Split 02/29/2020  . Influenza Whole 02/18/2009  . Influenza,inj,Quad PF,6+ Mos 02/13/2018  . Influenza-Unspecified 02/18/2013, 02/19/2015, 01/22/2017, 02/19/2019  . PFIZER(Purple Top)SARS-COV-2 Vaccination 06/09/2019, 06/30/2019  . Tdap 04/01/2014   There are no preventive care reminders to display for this patient.  Past Medical History:  Diagnosis Date  . Allergy   . ANEMIA-IRON DEFICIENCY   . Anxiety    xanax prn  . Asthma   . DEPRESSION    hx of  . Family history of colon cancer    rectal cancer in sister age 52  . GERD (gastroesophageal reflux disease)    Past Surgical History:  Procedure Laterality Date  . BREAST BIOPSY Left 01/28/2017   fibroadenoma  . BREAST CYST EXCISION     pt doesnt remember  .  COLONOSCOPY    . colonscopy    . left breast     lumpectomy left breast - cyst  . SVD     x 2  . TUBAL LIGATION      reports that she has never smoked. She has never used smokeless tobacco. She reports that she does not drink alcohol and does not use drugs. family history includes Breast cancer (age of onset: 41) in her maternal grandmother; Cancer in her maternal aunt and sister; Colon cancer (age of onset: 76) in her sister; Rectal cancer in her sister. Allergies  Allergen Reactions  . Latex Hives and Itching  . Aspirin Hives    Pt avoids ibuprofen , usually takes tylenol  . Lactose Intolerance (Gi)   . Moxifloxacin Nausea And Vomiting   Current Outpatient Medications on File Prior to Visit  Medication Sig Dispense Refill  . albuterol (PROVENTIL HFA;VENTOLIN HFA) 108 (90 Base) MCG/ACT inhaler Inhale 2 puffs into the lungs every 6 (six) hours as needed for wheezing or shortness of breath. 18 g 11  . Ascorbic Acid (VITAMIN C) 1000 MG tablet Take 1,000 mg by mouth daily.    . Biotin 5000 MCG CAPS Take 1 capsule by mouth every morning.    Marland Kitchen  gabapentin (NEURONTIN) 300 MG capsule Take 1 capsule by mouth every day. 90 capsule 3  . MAGNESIUM PO Take by mouth daily.    . Probiotic Product (PROBIOTIC DAILY) CAPS Take 1 capsule by mouth daily.    . tranexamic acid (LYSTEDA) 650 MG TABS tablet tranexamic acid 650 mg tablet    . tranexamic acid (LYSTEDA) 650 MG TABS tablet Take 2 tablets by mouth 3 times a day for 5 days. 30 tablet 0  . iron polysaccharides (NU-IRON) 150 MG capsule Take 1 capsule (150 mg total) by mouth daily. (Patient not taking: Reported on 09/01/2020) 90 capsule 3  . Vitamin D, Ergocalciferol, (DRISDOL) 1.25 MG (50000 UNIT) CAPS capsule Take 1 capsule (50,000 Units total) by mouth every 7 (seven) days. (Patient not taking: Reported on 09/01/2020) 12 capsule 0   No current facility-administered medications on file prior to visit.        ROS:  All others reviewed and  negative.  Objective        PE:  BP 118/74 (BP Location: Left Arm, Patient Position: Sitting, Cuff Size: Normal)   Pulse 73   Temp 98.1 F (36.7 C) (Oral)   Ht 5\' 4"  (1.626 m)   Wt 108 lb (49 kg)   LMP 02/10/2019   SpO2 98%   BMI 18.54 kg/m                 Constitutional: Pt appears in NAD               HENT: Head: NCAT.                Right Ear: External ear normal.                 Left Ear: External ear normal.                Eyes: . Pupils are equal, round, and reactive to light. Conjunctivae and EOM are normal               Nose: without d/c or deformity               Neck: Neck supple. Gross normal ROM               Cardiovascular: Normal rate and regular rhythm.                 Pulmonary/Chest: Effort normal and breath sounds without rales or wheezing.                Abd:  Soft, NT, ND, + BS, no organomegaly               Neurological: Pt is alert. At baseline orientation, motor grossly intact               Skin: Skin is warm. No rashes, no other new lesions, LE edema - none               Psychiatric: Pt behavior is normal without agitation   Micro: none  Cardiac tracings I have personally interpreted today:  none  Pertinent Radiological findings (summarize): none   Lab Results  Component Value Date   WBC 5.2 09/01/2020   HGB 12.2 09/01/2020   HCT 36.9 09/01/2020   PLT 296.0 09/01/2020   GLUCOSE 93 09/01/2020   CHOL 174 09/01/2020   TRIG 81.0 09/01/2020   HDL 67.90 09/01/2020   LDLCALC 90 09/01/2020   ALT 17 09/01/2020   AST 24  09/01/2020   NA 138 09/01/2020   K 4.4 09/01/2020   CL 102 09/01/2020   CREATININE 0.79 09/01/2020   BUN 11 09/01/2020   CO2 30 09/01/2020   TSH 1.06 09/01/2020   INR 1.1 ratio (H) 03/29/2010   Assessment/Plan:  Ashley Christensen is a 50 y.o. Black or African American [2] female with  has a past medical history of Allergy, ANEMIA-IRON DEFICIENCY, Anxiety, Asthma, DEPRESSION, Family history of colon cancer, and GERD  (gastroesophageal reflux disease).  Vitamin D deficiency Last vitamin D Lab Results  Component Value Date   VD25OH 24.99 (L) 09/01/2020   Low, to increase oral replacement to 2000 u qd  Iron deficiency anemia No recent overt bleeding, stable per pt per recent gyn appt  HLD (hyperlipidemia) Lab Results  Component Value Date   LDLCALC 90 09/01/2020   Stable, pt to continue current low chol diet, declines statin   Preventative health care Age and sex appropriate education and counseling updated with regular exercise and diet Referrals for preventative services - for hep c screen today, pt will call for mammogram herself Immunizations addressed - none needed Smoking counseling  - none needed Evidence for depression or other mood disorder - none significant Most recent labs reviewed. I have personally reviewed and have noted: 1) the patient's medical and social history 2) The patient's current medications and supplements 3) The patient's height, weight, and BMI have been recorded in the chart   Followup: Return in about 1 year (around 09/01/2021).  Cathlean Cower, MD 09/03/2020 8:16 PM [facility]

## 2020-09-01 NOTE — Patient Instructions (Signed)

## 2020-09-02 LAB — HEPATITIS C ANTIBODY
Hepatitis C Ab: NONREACTIVE
SIGNAL TO CUT-OFF: 0.01 (ref <1.00)

## 2020-09-03 ENCOUNTER — Encounter: Payer: Self-pay | Admitting: Internal Medicine

## 2020-09-03 NOTE — Assessment & Plan Note (Addendum)
Lab Results  Component Value Date   LDLCALC 90 09/01/2020   Stable, pt to continue current low chol diet, declines statin

## 2020-09-03 NOTE — Assessment & Plan Note (Signed)
Last vitamin D Lab Results  Component Value Date   VD25OH 24.99 (L) 09/01/2020   Low, to increase oral replacement to 2000 u qd

## 2020-09-03 NOTE — Assessment & Plan Note (Signed)
Age and sex appropriate education and counseling updated with regular exercise and diet Referrals for preventative services - for hep c screen today, pt will call for mammogram herself Immunizations addressed - none needed Smoking counseling  - none needed Evidence for depression or other mood disorder - none significant Most recent labs reviewed. I have personally reviewed and have noted: 1) the patient's medical and social history 2) The patient's current medications and supplements 3) The patient's height, weight, and BMI have been recorded in the chart

## 2020-09-03 NOTE — Assessment & Plan Note (Signed)
No recent overt bleeding, stable per pt per recent gyn appt

## 2020-09-09 ENCOUNTER — Encounter: Payer: Self-pay | Admitting: Internal Medicine

## 2020-09-09 DIAGNOSIS — F411 Generalized anxiety disorder: Secondary | ICD-10-CM

## 2020-09-09 DIAGNOSIS — F32A Depression, unspecified: Secondary | ICD-10-CM

## 2020-09-26 ENCOUNTER — Ambulatory Visit (INDEPENDENT_AMBULATORY_CARE_PROVIDER_SITE_OTHER): Payer: 59 | Admitting: Psychologist

## 2020-09-26 DIAGNOSIS — F321 Major depressive disorder, single episode, moderate: Secondary | ICD-10-CM

## 2020-09-26 DIAGNOSIS — F410 Panic disorder [episodic paroxysmal anxiety] without agoraphobia: Secondary | ICD-10-CM | POA: Diagnosis not present

## 2020-10-03 ENCOUNTER — Ambulatory Visit: Payer: 59 | Admitting: Psychologist

## 2020-11-02 ENCOUNTER — Other Ambulatory Visit: Payer: Self-pay

## 2020-11-02 ENCOUNTER — Telehealth (INDEPENDENT_AMBULATORY_CARE_PROVIDER_SITE_OTHER): Payer: 59 | Admitting: Family Medicine

## 2020-11-02 ENCOUNTER — Other Ambulatory Visit (HOSPITAL_COMMUNITY): Payer: Self-pay

## 2020-11-02 ENCOUNTER — Encounter: Payer: Self-pay | Admitting: Family Medicine

## 2020-11-02 DIAGNOSIS — M791 Myalgia, unspecified site: Secondary | ICD-10-CM | POA: Diagnosis not present

## 2020-11-02 DIAGNOSIS — R070 Pain in throat: Secondary | ICD-10-CM

## 2020-11-02 DIAGNOSIS — R6889 Other general symptoms and signs: Secondary | ICD-10-CM | POA: Diagnosis not present

## 2020-11-02 DIAGNOSIS — R059 Cough, unspecified: Secondary | ICD-10-CM

## 2020-11-02 MED ORDER — PROMETHAZINE-DM 6.25-15 MG/5ML PO SYRP
5.0000 mL | ORAL_SOLUTION | Freq: Four times a day (QID) | ORAL | 0 refills | Status: AC | PRN
  Filled 2020-11-02: qty 118, 6d supply, fill #0

## 2020-11-02 MED ORDER — PREDNISONE 20 MG PO TABS
40.0000 mg | ORAL_TABLET | Freq: Every day | ORAL | 0 refills | Status: AC
  Filled 2020-11-02: qty 10, 5d supply, fill #0

## 2020-11-02 NOTE — Progress Notes (Signed)
Chief Complaint  Patient presents with   Cough   Sore Throat    Body aches Waiting on Covid Test results. First day of symptoms Sunday 10/30/20 Headache    Dian Situ here for URI complaints. Due to COVID-19 pandemic, we are interacting via web portal for an electronic face-to-face visit. I verified patient's ID using 2 identifiers. Patient agreed to proceed with visit via this method. Patient is at home, I am at office. Patient and I are present for visit.   Duration: 3 days  Associated symptoms: subjective fever, sore throat, myalgia, and diarrhea, coughing Denies: sinus congestion, sinus pain, rhinorrhea, itchy watery eyes, ear pain, ear drainage, wheezing, shortness of breath, and N/V Treatment to date: ibuprofen, herbal teas Sick contacts: Yes; sick contact at work Covid testing is pending, home test neg.   Past Medical History:  Diagnosis Date   Allergy    ANEMIA-IRON DEFICIENCY    Anxiety    xanax prn   Asthma    DEPRESSION    hx of   Family history of colon cancer    rectal cancer in sister age 27   GERD (gastroesophageal reflux disease)     LMP 02/10/2019  No conversational dyspnea Age appropriate judgment and insight Nml affect and mood  Flu-like symptoms - Plan: predniSONE (DELTASONE) 20 MG tablet, promethazine-dextromethorphan (PROMETHAZINE-DM) 6.25-15 MG/5ML syrup  5 d pred burst, she has an allergy to asa. Syrup as needed. Warned about drowsiness.  Continue to push fluids, practice good hand hygiene, cover mouth when coughing. F/u prn. If starting to experience fevers, shaking, or shortness of breath, seek immediate care. Pt voiced understanding and agreement to the plan.  [place], DO 11/02/20 8:45 AM

## 2020-11-08 ENCOUNTER — Telehealth: Payer: Self-pay | Admitting: Internal Medicine

## 2020-11-08 NOTE — Telephone Encounter (Signed)
   Patient is COVID+ Patient had virtual visit 6/15 with Dr Nani Ravens. She is currently feeling worse with nausea, vomiting and fatigue. Patient called EMS today because she felt dizzy. EMS advised patient to call PCP  Patient states when she had the virtual appointment she was offered medication but didn't think she would need it however now she feels worse. She is requesting medication be prescribed.

## 2020-11-08 NOTE — Telephone Encounter (Signed)
[facility] FYI:  ---Caller states she was dx with Covid-19 last Wednesday. She is very sick today, she feels like she is going to faint, no energy, vomiting. Hard to stand up per patient.  Advised to call EMS [phone]

## 2020-11-09 ENCOUNTER — Other Ambulatory Visit (HOSPITAL_COMMUNITY): Payer: Self-pay

## 2020-11-09 ENCOUNTER — Encounter: Payer: Self-pay | Admitting: Internal Medicine

## 2020-11-09 MED ORDER — MECLIZINE HCL 25 MG PO TABS
12.5000 mg | ORAL_TABLET | Freq: Three times a day (TID) | ORAL | 1 refills | Status: AC | PRN
  Filled 2020-11-09: qty 15, 10d supply, fill #0

## 2020-11-16 ENCOUNTER — Emergency Department (HOSPITAL_BASED_OUTPATIENT_CLINIC_OR_DEPARTMENT_OTHER): Payer: 59 | Admitting: Radiology

## 2020-11-16 ENCOUNTER — Encounter (HOSPITAL_BASED_OUTPATIENT_CLINIC_OR_DEPARTMENT_OTHER): Payer: Self-pay | Admitting: Emergency Medicine

## 2020-11-16 ENCOUNTER — Other Ambulatory Visit: Payer: Self-pay

## 2020-11-16 ENCOUNTER — Emergency Department (HOSPITAL_BASED_OUTPATIENT_CLINIC_OR_DEPARTMENT_OTHER)
Admission: EM | Admit: 2020-11-16 | Discharge: 2020-11-16 | Disposition: A | Discharge status: Home / Self Care | Payer: 59 | Attending: Emergency Medicine | Admitting: Emergency Medicine

## 2020-11-16 DIAGNOSIS — R0602 Shortness of breath: Secondary | ICD-10-CM | POA: Insufficient documentation

## 2020-11-16 DIAGNOSIS — R072 Precordial pain: Secondary | ICD-10-CM | POA: Diagnosis not present

## 2020-11-16 DIAGNOSIS — R200 Anesthesia of skin: Secondary | ICD-10-CM | POA: Insufficient documentation

## 2020-11-16 DIAGNOSIS — R5383 Other fatigue: Secondary | ICD-10-CM | POA: Diagnosis not present

## 2020-11-16 DIAGNOSIS — R079 Chest pain, unspecified: Secondary | ICD-10-CM | POA: Diagnosis not present

## 2020-11-16 DIAGNOSIS — R61 Generalized hyperhidrosis: Secondary | ICD-10-CM | POA: Insufficient documentation

## 2020-11-16 DIAGNOSIS — Z8616 Personal history of COVID-19: Secondary | ICD-10-CM | POA: Insufficient documentation

## 2020-11-16 DIAGNOSIS — Z91040 Latex allergy status: Secondary | ICD-10-CM | POA: Insufficient documentation

## 2020-11-16 DIAGNOSIS — J45909 Unspecified asthma, uncomplicated: Secondary | ICD-10-CM | POA: Insufficient documentation

## 2020-11-16 DIAGNOSIS — R0789 Other chest pain: Secondary | ICD-10-CM | POA: Diagnosis not present

## 2020-11-16 LAB — CBC
HCT: 38.8 % (ref 36.0–46.0)
Hemoglobin: 12.6 g/dL (ref 12.0–15.0)
MCH: 28.8 pg (ref 26.0–34.0)
MCHC: 32.5 g/dL (ref 30.0–36.0)
MCV: 88.8 fL (ref 80.0–100.0)
Platelets: 349 10*3/uL (ref 150–400)
RBC: 4.37 MIL/uL (ref 3.87–5.11)
RDW: 12.8 % (ref 11.5–15.5)
WBC: 5.5 10*3/uL (ref 4.0–10.5)
nRBC: 0 % (ref 0.0–0.2)

## 2020-11-16 LAB — TROPONIN I (HIGH SENSITIVITY)
Troponin I (High Sensitivity): 3 ng/L (ref <18)
Troponin I (High Sensitivity): 3 ng/L (ref <18)

## 2020-11-16 LAB — BASIC METABOLIC PANEL
Anion gap: 9 (ref 5–15)
BUN: 7 mg/dL (ref 6–20)
CO2: 27 mmol/L (ref 22–32)
Calcium: 10 mg/dL (ref 8.9–10.3)
Chloride: 103 mmol/L (ref 98–111)
Creatinine, Ser: 0.67 mg/dL (ref 0.44–1.00)
GFR, Estimated: >60 mL/min (ref >60)
Glucose, Bld: 96 mg/dL (ref 70–99)
Potassium: 3.6 mmol/L (ref 3.5–5.1)
Sodium: 139 mmol/L (ref 135–145)

## 2020-11-16 LAB — PREGNANCY, URINE: Preg Test, Ur: NEGATIVE

## 2020-11-16 NOTE — ED Triage Notes (Addendum)
Pt presents to ED POV. Pt c/o CP, SOB, fatigue, hands tingling. Pt reports that CP began mon and was intermittent but has continued to worsen and become more consistent.  Describes pain as pressure. No cardiac hx.

## 2020-11-16 NOTE — Discharge Instructions (Addendum)
You are seen in the emergency department for chest pain along with some numbness and tingling in your arms.  You had an x-ray EKG and blood work that did not show any evidence of heart injury.  Please contact your primary care doctor for close follow-up.  Return to the emergency department if any worsening or concerning symptoms

## 2020-11-16 NOTE — ED Notes (Signed)
Patient transported to X-ray 

## 2020-11-16 NOTE — ED Provider Notes (Signed)
[facility] EMERG[facility]Y DEPT Provider Note   CSN: [id number] Arrival date & time: 11/16/20  1720     History Chief Complaint  Patient presents with   Chest Pain    Ashley Christensen is a 50 y.o. female.  She said she had COVID 2 weeks ago.  She just returned to work a few days ago.  For the last 3 days she has had intermittent pressure in her chest associated with some numbness and tingling in her arms and hands.  She was diaphoretic.  She felt very fatigued.  Symptoms seem to come and go.  Currently not having any symptoms.  The history is provided by the patient.  Chest Pain Pain location:  Substernal area Pain quality: pressure   Pain radiates to:  Does not radiate Pain severity:  Severe Onset quality:  Gradual Duration:  3 days Timing:  Intermittent Progression:  Unchanged Chronicity:  New Relieved by:  None tried Worsened by:  Nothing Ineffective treatments:  None tried Associated symptoms: diaphoresis, fatigue and shortness of breath   Associated symptoms: no abdominal pain, no cough, no dysphagia, no fever, no headache, no lower extremity edema, no nausea and no vomiting   Risk factors: no high cholesterol, no hypertension and no smoking       Past Medical History:  Diagnosis Date   Allergy    ANEMIA-IRON DEFICIENCY    Anxiety    xanax prn   Asthma    DEPRESSION    hx of   Family history of colon cancer    rectal cancer in sister age 72   GERD (gastroesophageal reflux disease)     Patient Active Problem List   Diagnosis Date Noted   Gastroesophageal reflux disease 06/24/2019   Genetic susceptibility to cancer 07/25/2018   Uterine leiomyoma 07/25/2018   Flu-like symptoms 05/29/2018   HLD (hyperlipidemia) 05/29/2018   Vitamin D deficiency 05/15/2017   Menorrhagia 04/25/2016   Insomnia 04/25/2016   Diarrhea 04/25/2016   Family history of malignant neoplasm of gastrointestinal tract 04/01/2014   Eustachian tube dysfunction 04/01/2014    Urinary frequency 07/15/2012   Preventative health care 07/15/2012   CERVICAL RADICULOPATHY, RIGHT 09/07/2009   Depression 02/17/2008   WEIGHT LOSS 02/17/2008   Iron deficiency anemia 07/28/2007   Anxiety state 07/28/2007    Past Surgical History:  Procedure Laterality Date   BREAST BIOPSY Left 01/28/2017   fibroadenoma   BREAST CYST EXCISION     pt doesnt remember   COLONOSCOPY     colonscopy     left breast     lumpectomy left breast - cyst   SVD     x 2   TUBAL LIGATION       OB History     Gravida  2   Para  2   Term  2   Preterm      AB      Living  2      SAB      IAB      Ectopic      Multiple      Live Births              Family History  Problem Relation Age of Onset   Cancer Sister    Colon cancer Sister 44   Rectal cancer Sister    Cancer Maternal Aunt    Breast cancer Maternal Grandmother 30       died 70   Colon polyps Neg Hx  Esophageal cancer Neg Hx    Stomach cancer Neg Hx     Social History   Tobacco Use   Smoking status: Never   Smokeless tobacco: Never  Vaping Use   Vaping Use: Never used  Substance Use Topics   Alcohol use: No   Drug use: No    Home Medications Prior to Admission medications   Medication Sig Start Date End Date Taking? Authorizing Provider  albuterol (PROVENTIL HFA;VENTOLIN HFA) 108 (90 Base) MCG/ACT inhaler Inhale 2 puffs into the lungs every 6 (six) hours as needed for wheezing or shortness of breath. 07/01/18   Biagio Borg, MD  ALPRAZolam Duanne Moron) 0.25 MG tablet Take 1 tablet (0.25 mg total) by mouth 2 (two) times daily as needed. for anxiety 09/01/20   Biagio Borg, MD  Ascorbic Acid (VITAMIN C) 1000 MG tablet Take 1,000 mg by mouth daily.    [provider]  Biotin 5000 MCG CAPS Take 1 capsule by mouth every morning.    [provider]  gabapentin (NEURONTIN) 300 MG capsule Take 1 capsule by mouth every day. 08/29/20     iron polysaccharides (NU-IRON) 150 MG capsule Take  1 capsule (150 mg total) by mouth daily. 06/24/19   Biagio Borg, MD  MAGNESIUM PO Take by mouth daily.    [provider]  meclizine (ANTIVERT) 25 MG tablet Take 1/2 tablet (12.5 mg total) by mouth 3 (three) times daily as needed for dizziness. 11/09/20 11/09/21  Biagio Borg, MD  pantoprazole (PROTONIX) 40 MG tablet Take 1 tablet (40 mg total) by mouth 2 (two) times daily. 09/01/20   Biagio Borg, MD  Probiotic Product (PROBIOTIC DAILY) CAPS Take 1 capsule by mouth daily.    [provider]  promethazine-dextromethorphan (PROMETHAZINE-DM) 6.25-15 MG/5ML syrup Take 5 mLs by mouth 4 (four) times daily as needed for cough. 11/02/20   Shelda Pal, DO  tranexamic acid (LYSTEDA) 650 MG TABS tablet tranexamic acid 650 mg tablet    [provider]  tranexamic acid (LYSTEDA) 650 MG TABS tablet Take 2 tablets by mouth 3 times a day for 5 days. 08/29/20     Vitamin D, Ergocalciferol, (DRISDOL) 1.25 MG (50000 UNIT) CAPS capsule Take 1 capsule (50,000 Units total) by mouth every 7 (seven) days. 06/25/19   Biagio Borg, MD    Allergies    Latex, Aspirin, Lactose intolerance (gi), and Moxifloxacin  Review of Systems   Review of Systems  Constitutional:  Positive for diaphoresis and fatigue. Negative for fever.  HENT:  Negative for trouble swallowing.   Eyes:  Negative for visual disturbance.  Respiratory:  Positive for shortness of breath. Negative for cough.   Cardiovascular:  Positive for chest pain.  Gastrointestinal:  Negative for abdominal pain, nausea and vomiting.  Genitourinary:  Negative for dysuria.  Musculoskeletal:  Negative for neck pain.  Skin:  Negative for rash.  Neurological:  Negative for headaches.   Physical Exam Updated Vital Signs BP (!) 157/83 (BP Location: Right Arm)   Pulse 61   Temp 97.9 F (36.6 C) (Oral)   Resp 18   Ht 5\' 4"  (1.626 m)   Wt 48.5 kg   LMP 02/10/2019   SpO2 100%   BMI 18.37 kg/m   Physical Exam Vitals and nursing  note reviewed.  Constitutional:      General: She is not in acute distress.    Appearance: She is well-developed.  HENT:     Head: Normocephalic and atraumatic.  Eyes:     Conjunctiva/sclera: Conjunctivae normal.  Cardiovascular:     Rate and Rhythm: Normal rate and regular rhythm.     Heart sounds: Normal heart sounds. No murmur heard. Pulmonary:     Effort: Pulmonary effort is normal. No respiratory distress.     Breath sounds: Normal breath sounds.  Abdominal:     Palpations: Abdomen is soft.     Tenderness: There is no abdominal tenderness.  Musculoskeletal:        General: Normal range of motion.     Cervical back: Neck supple.     Right lower leg: No tenderness. No edema.     Left lower leg: No tenderness. No edema.  Skin:    General: Skin is warm and dry.  Neurological:     General: No focal deficit present.     Mental Status: She is alert.    ED Results / Procedures / Treatments   Labs (all labs ordered are listed, but only abnormal results are displayed) Labs Reviewed  BASIC METABOLIC PANEL  CBC  PREGNANCY, URINE  TROPONIN I (HIGH SENSITIVITY)  TROPONIN I (HIGH SENSITIVITY)    EKG EKG Interpretation  Date/Time:  Wednesday November 16 2020 17:34:23 EDT Ventricular Rate:  66 PR Interval:  128 QRS Duration: 88 QT Interval:  392 QTC Calculation: 410 R Axis:   80 Text Interpretation: Normal sinus rhythm with sinus arrhythmia Normal ECG No significant change since prior 8/15 Confirmed by Aletta Edouard [phone]) on 11/16/2020 5:37:22 PM  Radiology DG Chest 2 View  Result Date: 11/16/2020 CLINICAL DATA:  50 year old female with chest pain. EXAM: CHEST - 2 VIEW COMPARISON:  Chest radiograph dated 08/06/2016 FINDINGS: No focal consolidation, pleural effusion, or pneumothorax. The cardiac silhouette is within normal limits. No acute osseous pathology. IMPRESSION: No active cardiopulmonary disease. Electronically Signed   By: Anner Crete M.D.   On: 11/16/2020 18:44     Procedures Procedures   Medications Ordered in ED Medications - No data to display  ED Course  I have reviewed the triage vital signs and the nursing notes.  Pertinent labs & imaging results that were available during my care of the patient were reviewed by me and considered in my medical decision making (see chart for details).  Clinical Course as of 11/17/20 0914  Wed Nov 16, 2020  1828 Chest x-ray interpreted by me as no acute pulmonary disease.  Awaiting radiology reading. [MB]    Clinical Course User Index [MB] Hayden Rasmussen, MD   MDM Rules/Calculators/A&P                         This patient complains of chest pain and tingling in her arms and hands; this involves an extensive number of treatment Options and is a complaint that carries with it a high risk of complications and Morbidity. The differential includes ACS, pneumonia, PE, pneumothorax, vascular, musculoskeletal, reflux, post COVID symptoms  I ordered, reviewed and interpreted labs, which included CBC with normal white count normal hemoglobin, chemistries normal, troponins flat  I ordered imaging studies which included chest x-ray and I independently    visualized and interpreted imaging which showed no acute infiltrates  Previous records obtained and reviewed in epic, no recent admissions  After the interventions stated above, I reevaluated the patient and found patient to be very well-appearing.  Tachycardic not tachypneic and saturations 100% on room air.  Doubt PE.  No evidence of ischemia.  Recommended follow-up with  her primary care doctor.   Final Clinical Impression(s) / ED Diagnoses Final diagnoses:  Nonspecific chest pain    Rx / DC Orders ED Discharge Orders     None        Hayden Rasmussen, MD 11/17/20 [phone]

## 2020-11-16 NOTE — ED Notes (Signed)
Patient verbalized understanding of discharge instructions. No distress noted. All questions and concerns addressed. Ambulatory on discharge.

## 2020-12-20 ENCOUNTER — Other Ambulatory Visit (HOSPITAL_COMMUNITY): Payer: Self-pay

## 2020-12-29 ENCOUNTER — Encounter: Payer: Self-pay | Admitting: Internal Medicine

## 2021-02-15 ENCOUNTER — Other Ambulatory Visit (HOSPITAL_COMMUNITY): Payer: Self-pay

## 2021-02-17 ENCOUNTER — Other Ambulatory Visit (HOSPITAL_COMMUNITY): Payer: Self-pay

## 2021-08-03 ENCOUNTER — Encounter: Payer: 59 | Admitting: Internal Medicine

## 2021-09-07 ENCOUNTER — Encounter: Payer: 59 | Admitting: Internal Medicine

## 2021-12-20 DIAGNOSIS — N951 Menopausal and female climacteric states: Secondary | ICD-10-CM | POA: Insufficient documentation

## 2021-12-21 ENCOUNTER — Other Ambulatory Visit (HOSPITAL_COMMUNITY): Payer: Self-pay

## 2021-12-21 ENCOUNTER — Encounter: Payer: Self-pay | Admitting: Internal Medicine

## 2021-12-21 ENCOUNTER — Ambulatory Visit (INDEPENDENT_AMBULATORY_CARE_PROVIDER_SITE_OTHER): Payer: No Typology Code available for payment source | Admitting: Internal Medicine

## 2021-12-21 VITALS — BP 124/80 | HR 71 | Temp 98.3°F | Ht 64.0 in | Wt 102.4 lb

## 2021-12-21 DIAGNOSIS — F5101 Primary insomnia: Secondary | ICD-10-CM | POA: Diagnosis not present

## 2021-12-21 DIAGNOSIS — D509 Iron deficiency anemia, unspecified: Secondary | ICD-10-CM | POA: Diagnosis not present

## 2021-12-21 DIAGNOSIS — E559 Vitamin D deficiency, unspecified: Secondary | ICD-10-CM | POA: Diagnosis not present

## 2021-12-21 DIAGNOSIS — Z0001 Encounter for general adult medical examination with abnormal findings: Secondary | ICD-10-CM | POA: Diagnosis not present

## 2021-12-21 DIAGNOSIS — F32A Depression, unspecified: Secondary | ICD-10-CM | POA: Diagnosis not present

## 2021-12-21 DIAGNOSIS — E785 Hyperlipidemia, unspecified: Secondary | ICD-10-CM

## 2021-12-21 DIAGNOSIS — M19049 Primary osteoarthritis, unspecified hand: Secondary | ICD-10-CM | POA: Insufficient documentation

## 2021-12-21 DIAGNOSIS — E538 Deficiency of other specified B group vitamins: Secondary | ICD-10-CM

## 2021-12-21 LAB — CBC WITH DIFFERENTIAL/PLATELET
Basophils Absolute: 0 10*3/uL (ref 0.0–0.1)
Basophils Relative: 0.8 % (ref 0.0–3.0)
Eosinophils Absolute: 0.1 10*3/uL (ref 0.0–0.7)
Eosinophils Relative: 2 % (ref 0.0–5.0)
HCT: 37.9 % (ref 36.0–46.0)
Hemoglobin: 12.5 g/dL (ref 12.0–15.0)
Lymphocytes Relative: 54.1 % — ABNORMAL HIGH (ref 12.0–46.0)
Lymphs Abs: 2.6 10*3/uL (ref 0.7–4.0)
MCHC: 33.1 g/dL (ref 30.0–36.0)
MCV: 87.9 fl (ref 78.0–100.0)
Monocytes Absolute: 0.3 10*3/uL (ref 0.1–1.0)
Monocytes Relative: 6.3 % (ref 3.0–12.0)
Neutro Abs: 1.8 10*3/uL (ref 1.4–7.7)
Neutrophils Relative %: 36.8 % — ABNORMAL LOW (ref 43.0–77.0)
Platelets: 300 10*3/uL (ref 150.0–400.0)
RBC: 4.31 Mil/uL (ref 3.87–5.11)
RDW: 13.1 % (ref 11.5–15.5)
WBC: 4.8 10*3/uL (ref 4.0–10.5)

## 2021-12-21 LAB — URINALYSIS, ROUTINE W REFLEX MICROSCOPIC
Bilirubin Urine: NEGATIVE
Ketones, ur: NEGATIVE
Nitrite: NEGATIVE
Specific Gravity, Urine: 1.02 (ref 1.000–1.030)
Total Protein, Urine: NEGATIVE
Urine Glucose: NEGATIVE
Urobilinogen, UA: 1 (ref 0.0–1.0)
pH: 7 (ref 5.0–8.0)

## 2021-12-21 LAB — BASIC METABOLIC PANEL
BUN: 12 mg/dL (ref 6–23)
CO2: 30 mEq/L (ref 19–32)
Calcium: 10.1 mg/dL (ref 8.4–10.5)
Chloride: 103 mEq/L (ref 96–112)
Creatinine, Ser: 0.66 mg/dL (ref 0.40–1.20)
GFR: 100.4 mL/min (ref >60.00)
Glucose, Bld: 96 mg/dL (ref 70–99)
Potassium: 4.2 mEq/L (ref 3.5–5.1)
Sodium: 139 mEq/L (ref 135–145)

## 2021-12-21 LAB — IBC PANEL
Iron: 77 ug/dL (ref 42–145)
Saturation Ratios: 19.2 % — ABNORMAL LOW (ref 20.0–50.0)
TIBC: 401.8 ug/dL (ref 250.0–450.0)
Transferrin: 287 mg/dL (ref 212.0–360.0)

## 2021-12-21 LAB — LIPID PANEL
Cholesterol: 217 mg/dL — ABNORMAL HIGH (ref 0–200)
HDL: 75 mg/dL (ref >39.00)
LDL Cholesterol: 124 mg/dL — ABNORMAL HIGH (ref 0–99)
NonHDL: 141.8
Total CHOL/HDL Ratio: 3
Triglycerides: 88 mg/dL (ref 0.0–149.0)
VLDL: 17.6 mg/dL (ref 0.0–40.0)

## 2021-12-21 LAB — HEPATIC FUNCTION PANEL
ALT: 22 U/L (ref 0–35)
AST: 26 U/L (ref 0–37)
Albumin: 4.5 g/dL (ref 3.5–5.2)
Alkaline Phosphatase: 81 U/L (ref 39–117)
Bilirubin, Direct: 0.3 mg/dL (ref 0.0–0.3)
Total Bilirubin: 1.5 mg/dL — ABNORMAL HIGH (ref 0.2–1.2)
Total Protein: 7.9 g/dL (ref 6.0–8.3)

## 2021-12-21 LAB — TSH: TSH: 1.29 u[IU]/mL (ref 0.35–5.50)

## 2021-12-21 LAB — VITAMIN D 25 HYDROXY (VIT D DEFICIENCY, FRACTURES): VITD: 18.65 ng/mL — ABNORMAL LOW (ref 30.00–100.00)

## 2021-12-21 LAB — FERRITIN: Ferritin: 34.6 ng/mL (ref 10.0–291.0)

## 2021-12-21 LAB — VITAMIN B12: Vitamin B-12: 595 pg/mL (ref 211–911)

## 2021-12-21 MED ORDER — TRAZODONE HCL 50 MG PO TABS
50.0000 mg | ORAL_TABLET | Freq: Every day | ORAL | 1 refills | Status: DC
  Filled 2021-12-21: qty 90, 90d supply, fill #0
  Filled 2022-03-20: qty 30, 30d supply, fill #1
  Filled 2022-04-18: qty 30, 30d supply, fill #2

## 2021-12-21 NOTE — Assessment & Plan Note (Signed)
Mild, declines tx or referral for now, no SI or HI

## 2021-12-21 NOTE — Assessment & Plan Note (Signed)
Age and sex appropriate education and counseling updated with regular exercise and diet Referrals for preventative services - none needed Immunizations addressed - declines shingrix Smoking counseling  - none needed Evidence for depression or other mood disorder - mild depression anxiety, declines tx for now Most recent labs reviewed. I have personally reviewed and have noted: 1) the patient's medical and social history 2) The patient's current medications and supplements 3) The patient's height, weight, and BMI have been recorded in the chart

## 2021-12-21 NOTE — Assessment & Plan Note (Signed)
No recent overt bleeding, for f/u iron levels

## 2021-12-21 NOTE — Assessment & Plan Note (Signed)
Also ok for oc voltaren gel prn

## 2021-12-21 NOTE — Assessment & Plan Note (Signed)
Last vitamin D Lab Results  Component Value Date   VD25OH 24.99 (L) 09/01/2020   Low, to start oral replacement

## 2021-12-21 NOTE — Progress Notes (Signed)
Patient ID: Ashley Christensen, female   DOB: 01-13-1971, 51 y.o.   MRN: [id number]         Chief Complaint:: wellness exam and low vit d, insomnia, hand arthritis, and wt loss       HPI:  Ashley Christensen is a 51 y.o. 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  Now as Masters with [employer]. Has mild worsening depression and does not want tx, but has worsening sleep difficulty, has tried multiple otc and not helping.  Brain races, cant get to sleep, excercising every day.  Has lost several lbs recently with stress, admits to mild depression but declines tx for now..  Seeing GYN - now postmenopausal.  Has been more thirsty, wondering about her sugar  Pt denies chest pain, increased sob or doe, wheezing, orthopnea, PND, increased LE swelling, palpitations, dizziness or syncope.   Pt denies polydipsia, polyuria, or new focal neuro s/s.    Pt denies fever, night sweats, loss of appetite, or other constitutional symptoms  Wt Readings from Last 3 Encounters:  12/21/21 102 lb 6.4 oz (46.4 kg)  11/16/20 107 lb (48.5 kg)  09/01/20 108 lb (49 kg)   BP Readings from Last 3 Encounters:  12/21/21 124/80  11/16/20 130/83  09/01/20 118/74   Immunization History  Administered Date(s) Administered   Influenza Split 02/29/2020   Influenza Whole 02/18/2009   Influenza,inj,Quad PF,6+ Mos 02/13/2018   Influenza-Unspecified 02/18/2013, 02/19/2015, 01/22/2017, 02/19/2019   PFIZER(Purple Top)SARS-COV-2 Vaccination 06/09/2019, 06/30/2019   Tdap 04/01/2014   There are no preventive care reminders to display for this patient.     Past Medical History:  Diagnosis Date   Allergy    ANEMIA-IRON DEFICIENCY    Anxiety    xanax prn   Asthma    DEPRESSION    hx of   Family history of colon cancer    rectal cancer in sister age 72   GERD (gastroesophageal  reflux disease)    Past Surgical History:  Procedure Laterality Date   BREAST BIOPSY Left 01/28/2017   fibroadenoma   BREAST CYST EXCISION     pt doesnt remember   COLONOSCOPY     colonscopy     left breast     lumpectomy left breast - cyst   SVD     x 2   TUBAL LIGATION      reports that she has never smoked. She has never used smokeless tobacco. She reports that she does not drink alcohol and does not use drugs. family history includes Breast cancer (age of onset: 51) in her maternal grandmother; Cancer in her maternal aunt and sister; Colon cancer (age of onset: 33) in her sister; Rectal cancer in her sister. Allergies  Allergen Reactions   Latex Hives and Itching   Aspirin Hives    Pt avoids ibuprofen , usually takes tylenol   Lactose Intolerance (Gi)    Moxifloxacin Nausea And Vomiting   Current Outpatient Medications on File Prior to Visit  Medication Sig Dispense Refill   albuterol (PROVENTIL HFA;VENTOLIN HFA) 108 (90 Base) MCG/ACT inhaler Inhale 2 puffs into the lungs every 6 (six) hours as needed for wheezing or shortness of breath. 18 g 11   ALPRAZolam (XANAX) 0.25 MG  tablet Take 1 tablet (0.25 mg total) by mouth 2 (two) times daily as needed. for anxiety 60 tablet 2   Ascorbic Acid (VITAMIN C) 1000 MG tablet Take 1,000 mg by mouth daily.     Biotin 5000 MCG CAPS Take 1 capsule by mouth every morning.     gabapentin (NEURONTIN) 300 MG capsule Take 1 capsule by mouth every day. 90 capsule 3   iron polysaccharides (NU-IRON) 150 MG capsule Take 1 capsule (150 mg total) by mouth daily. 90 capsule 3   MAGNESIUM PO Take by mouth daily.     pantoprazole (PROTONIX) 40 MG tablet Take 1 tablet (40 mg total) by mouth 2 (two) times daily. 180 tablet 3   Probiotic Product (PROBIOTIC DAILY) CAPS Take 1 capsule by mouth daily.     promethazine-dextromethorphan (PROMETHAZINE-DM) 6.25-15 MG/5ML syrup Take 5 mLs by mouth 4 (four) times daily as needed for cough. 118 mL 0   tranexamic  acid (LYSTEDA) 650 MG TABS tablet tranexamic acid 650 mg tablet     Vitamin D, Ergocalciferol, (DRISDOL) 1.25 MG (50000 UNIT) CAPS capsule Take 1 capsule (50,000 Units total) by mouth every 7 (seven) days. 12 capsule 0   No current facility-administered medications on file prior to visit.        ROS:  All others reviewed and negative.  Objective        PE:  BP 124/80 (BP Location: Right Arm, Patient Position: Sitting, Cuff Size: Normal)   Pulse 71   Temp 98.3 F (36.8 C) (Oral)   Ht '5\' 4"'$  (1.626 m)   Wt 102 lb 6.4 oz (46.4 kg)   LMP 05/24/2018   SpO2 99%   BMI 17.58 kg/m                 Constitutional: Pt appears in NAD               HENT: Head: NCAT.                Right Ear: External ear normal.                 Left Ear: External ear normal.                Eyes: . Pupils are equal, round, and reactive to light. Conjunctivae and EOM are normal               Nose: without d/c or deformity               Neck: Neck supple. Gross normal ROM               Cardiovascular: Normal rate and regular rhythm.                 Pulmonary/Chest: Effort normal and breath sounds without rales or wheezing.                Abd:  Soft, NT, ND, + BS, no organomegaly               Neurological: Pt is alert. At baseline orientation, motor grossly intact               Skin: Skin is warm. No rashes, no other new lesions, LE edema - none               Psychiatric: Pt behavior is normal without agitation   Micro: none  Cardiac tracings I have personally interpreted today:  none  Pertinent Radiological  findings (summarize): none   Lab Results  Component Value Date   WBC 5.5 11/16/2020   HGB 12.6 11/16/2020   HCT 38.8 11/16/2020   PLT 349 11/16/2020   GLUCOSE 96 11/16/2020   CHOL 174 09/01/2020   TRIG 81.0 09/01/2020   HDL 67.90 09/01/2020   LDLCALC 90 09/01/2020   ALT 17 09/01/2020   AST 24 09/01/2020   NA 139 11/16/2020   K 3.6 11/16/2020   CL 103 11/16/2020   CREATININE 0.67 11/16/2020    BUN 7 11/16/2020   CO2 27 11/16/2020   TSH 1.06 09/01/2020   INR 1.1 ratio (H) 03/29/2010   Assessment/Plan:  Ashley Christensen is a 51 y.o. Black or African American [2] female with  has a past medical history of Allergy, ANEMIA-IRON DEFICIENCY, Anxiety, Asthma, DEPRESSION, Family history of colon cancer, and GERD (gastroesophageal reflux disease).  Vitamin D deficiency Last vitamin D Lab Results  Component Value Date   VD25OH 24.99 (L) 09/01/2020   Low, to start oral replacement   Encounter for well adult exam with abnormal findings Age and sex appropriate education and counseling updated with regular exercise and diet Referrals for preventative services - none needed Immunizations addressed - declines shingrix Smoking counseling  - none needed Evidence for depression or other mood disorder - mild depression anxiety, declines tx for now Most recent labs reviewed. I have personally reviewed and have noted: 1) the patient's medical and social history 2) The patient's current medications and supplements 3) The patient's height, weight, and BMI have been recorded in the chart   Iron deficiency anemia No recent overt bleeding, for f/u iron levels  Depression Mild, declines tx or referral for now, no SI or HI  Insomnia Most likely stress related, for trazodone qhs prn '50mg'$ ,  to f/u any worsening symptoms or concerns  HLD (hyperlipidemia) Lab Results  Component Value Date   LDLCALC 90 09/01/2020   Stable, pt to continue current low chol diet   Hand arthritis Also ok for oc voltaren gel prn  Followup: No follow-ups on file.  Cathlean Cower, MD 12/21/2021 1:51 PM [facility]

## 2021-12-21 NOTE — Patient Instructions (Signed)
Please take all new medication as prescribed - the trazodone 50 mg at bedtime, and please call if you feel you ned the 100 mg  You can also use the Voltaren Gel OTC for the hand arthritis as needed  Please take OTC Vitamin D3 at 2000 units per day, indefinitely  Please continue all other medications as before, and refills have been done if requested.  Please have the pharmacy call with any other refills you may need.  Please continue your efforts at being more active, low cholesterol diet, and weight control.  You are otherwise up to date with prevention measures today.  Please keep your appointments with your specialists as you may have planned  Please go to the LAB at the blood drawing area for the tests to be done  You will be contacted by phone if any changes need to be made immediately.  Otherwise, you will receive a letter about your results with an explanation, but please check with MyChart first.  Please remember to sign up for MyChart if you have not done so, as this will be important to you in the future with finding out test results, communicating by private email, and scheduling acute appointments online when needed.  Please make an Appointment to return for your 1 year visit, or sooner if needed

## 2021-12-21 NOTE — Assessment & Plan Note (Signed)
Lab Results  Component Value Date   LDLCALC 90 09/01/2020   Stable, pt to continue current low chol diet

## 2021-12-21 NOTE — Assessment & Plan Note (Addendum)
Most likely stress related, for trazodone qhs prn '50mg'$ ,  to f/u any worsening symptoms or concerns

## 2022-03-01 ENCOUNTER — Other Ambulatory Visit (HOSPITAL_COMMUNITY): Payer: Self-pay

## 2022-03-01 MED ORDER — AZITHROMYCIN 250 MG PO TABS
ORAL_TABLET | ORAL | 0 refills | Status: AC
  Filled 2022-03-01: qty 6, 5d supply, fill #0

## 2022-03-24 ENCOUNTER — Other Ambulatory Visit (HOSPITAL_COMMUNITY): Payer: Self-pay

## 2022-04-18 ENCOUNTER — Encounter: Payer: Self-pay | Admitting: Internal Medicine

## 2022-04-19 ENCOUNTER — Other Ambulatory Visit (HOSPITAL_COMMUNITY): Payer: Self-pay

## 2022-04-19 MED ORDER — TRAZODONE HCL 100 MG PO TABS
100.0000 mg | ORAL_TABLET | Freq: Every day | ORAL | 1 refills | Status: AC
  Filled 2022-04-19: qty 30, 30d supply, fill #0
  Filled 2022-04-26: qty 31, 31d supply, fill #0
  Filled 2022-06-18: qty 31, 31d supply, fill #1
  Filled 2022-08-25: qty 31, 31d supply, fill #2
  Filled 2023-01-04: qty 31, 31d supply, fill #3

## 2022-04-19 NOTE — Telephone Encounter (Signed)
Patient has been taking the Trazodone but is still not able to sleep, do you recommend a higher dose. Please advise.

## 2022-04-19 NOTE — Telephone Encounter (Signed)
[place] for increased traz to 100 mg qhs prn - done erx

## 2022-04-20 ENCOUNTER — Other Ambulatory Visit (HOSPITAL_COMMUNITY): Payer: Self-pay

## 2022-04-26 ENCOUNTER — Other Ambulatory Visit (HOSPITAL_COMMUNITY): Payer: Self-pay

## 2022-05-06 DIAGNOSIS — L918 Other hypertrophic disorders of the skin: Secondary | ICD-10-CM | POA: Insufficient documentation

## 2022-05-13 ENCOUNTER — Telehealth: Payer: No Typology Code available for payment source | Admitting: Nurse Practitioner

## 2022-05-13 DIAGNOSIS — L239 Allergic contact dermatitis, unspecified cause: Secondary | ICD-10-CM

## 2022-05-13 MED ORDER — TRIAMCINOLONE ACETONIDE 0.1 % EX CREA: 1.0000 | TOPICAL_CREAM | Freq: Two times a day (BID) | CUTANEOUS | 0 refills | Status: AC

## 2022-05-13 NOTE — Progress Notes (Signed)
I have spent 5 minutes in review of e-visit questionnaire, review and updating patient chart, medical decision making and response to patient.  ° °Dalinda He[employer], NP ° °  °

## 2022-05-13 NOTE — Progress Notes (Signed)
E Visit for Rash  We are sorry that you are not feeling well. Here is how we plan to help!  Based on what you shared with me it looks like you have contact dermatitis.  Contact dermatitis is a skin rash caused by something that touches the skin and causes irritation or inflammation.  Your skin may be red, swollen, dry, cracked, and itch.  The rash should go away in a few days but can last a few weeks.  If you get a rash, it's important to figure out what caused it so the irritant can be avoided in the future.  I have prescribed a steroid cream for you to apply to these areas.    HOME CARE:  Take cool showers and avoid direct sunlight. Apply cool compress or wet dressings. Take a bath in an oatmeal bath.  Sprinkle content of one Aveeno packet under running faucet with comfortably warm water.  Bathe for 15-20 minutes, 1-2 times daily.  Pat dry with a towel. Do not rub the rash. Use hydrocortisone cream. Take an antihistamine like Benadryl for widespread rashes that itch.  The adult dose of Benadryl is 25-50 mg by mouth 4 times daily. Caution:  This type of medication may cause sleepiness.  Do not drink alcohol, drive, or operate dangerous machinery while taking antihistamines.  Do not take these medications if you have prostate enlargement.  Read package instructions thoroughly on all medications that you take.  GET HELP RIGHT AWAY IF:  Symptoms don't go away after treatment. Severe itching that persists. If you rash spreads or swells. If you rash begins to smell. If it blisters and opens or develops a yellow-brown crust. You develop a fever. You have a sore throat. You become short of breath.  MAKE SURE YOU:  Understand these instructions. Will watch your condition. Will get help right away if you are not doing well or get worse.  Thank you for choosing an e-visit.  Your e-visit answers were reviewed by a board certified advanced clinical practitioner to complete your personal  care plan. Depending upon the condition, your plan could have included both over the counter or prescription medications.  Please review your pharmacy choice. Make sure the pharmacy is open so you can pick up prescription now. If there is a problem, you may contact your provider through [employer] and have the prescription routed to another pharmacy.  Your safety is important to [place]. If you have drug allergies check your prescription carefully.   For the next 24 hours you can use MyChart to ask questions about today's visit, request a non-urgent call back, or ask for a work or school excuse. You will get an email in the next two days asking about your experience. I hope that your e-visit has been valuable and will speed your recovery.

## 2022-05-14 ENCOUNTER — Other Ambulatory Visit: Payer: Self-pay

## 2022-05-17 ENCOUNTER — Encounter: Payer: Self-pay | Admitting: Internal Medicine

## 2022-05-18 ENCOUNTER — Ambulatory Visit (HOSPITAL_COMMUNITY)
Admission: EM | Admit: 2022-05-18 | Discharge: 2022-05-18 | Disposition: A | Discharge status: Home / Self Care | Payer: 59 | Attending: Emergency Medicine | Admitting: Emergency Medicine

## 2022-05-18 DIAGNOSIS — L259 Unspecified contact dermatitis, unspecified cause: Secondary | ICD-10-CM | POA: Diagnosis not present

## 2022-05-18 DIAGNOSIS — R21 Rash and other nonspecific skin eruption: Secondary | ICD-10-CM

## 2022-05-18 MED ORDER — PREDNISONE 10 MG (21) PO TBPK: ORAL_TABLET | Freq: Every day | ORAL | 0 refills | Status: DC

## 2022-05-18 MED ORDER — GABAPENTIN 300 MG PO CAPS: 300.0000 mg | ORAL_CAPSULE | Freq: Two times a day (BID) | ORAL | 0 refills | Status: DC

## 2022-05-18 NOTE — Discharge Instructions (Addendum)
I recommend take the gabapentin twice daily for the next week or so. This should help with pain from the rash.  Take the prednisone taper as prescribed on the packaging. It will decrease by 1 pill each day.  Please follow up with primary care provider if symptoms persist.

## 2022-05-18 NOTE — ED Provider Notes (Signed)
[facility]    CSN: [id number] Arrival date & time: 05/18/22  1804      History   Chief Complaint Chief Complaint  Patient presents with   Arm Pain    HPI Ashley Christensen is a 51 y.o. female.  Presents with 1 week history of rash It has been very itchy Reports recently it became painful. Feels her arm is "on fire"  Had virtual visit, dx with contact dermatitis, given triamcinolone cream. This somewhat helped with itch but only temporary   Denies known exposures to allergens. No new products or medications.  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 07/25/2018   Uterine leiomyoma 07/25/2018   Flu-like symptoms 05/29/2018   HLD (hyperlipidemia) 05/29/2018   Vitamin D deficiency 05/15/2017   Menorrhagia 04/25/2016   Insomnia 04/25/2016   Diarrhea 04/25/2016   Family history of malignant neoplasm of gastrointestinal tract 04/01/2014   Eustachian tube dysfunction 04/01/2014   Urinary frequency 07/15/2012   Encounter for well adult exam with abnormal findings 07/15/2012   CERVICAL RADICULOPATHY, RIGHT 09/07/2009   Depression 02/17/2008   WEIGHT LOSS 02/17/2008   Iron deficiency anemia 07/28/2007   Anxiety state 07/28/2007    Past Surgical History:  Procedure Laterality Date   BREAST BIOPSY Left 01/28/2017   fibroadenoma   BREAST CYST EXCISION     pt doesnt remember   COLONOSCOPY     colonscopy     left breast     lumpectomy left breast - cyst   SVD     x 2   TUBAL LIGATION       OB History     Gravida  2   Para  2   Term  2   Preterm      AB      Living  2      SAB      IAB      Ectopic      Multiple      Live Births               Home Medications    Prior to Admission medications   Medication Sig Start Date End Date Taking? Authorizing Provider  gabapentin (NEURONTIN) 300 MG capsule Take 1 capsule (300 mg total) by mouth 2 (two) times daily. 05/18/22  Yes Tylin Force, Wells Guiles, PA-C  predniSONE (STERAPRED UNI-PAK 21 TAB) 10 MG (21) TBPK tablet Take by mouth daily. Take as prescribed on packaging 05/18/22  Yes Tniya Bowditch, Wells Guiles, PA-C  albuterol (PROVENTIL HFA;VENTOLIN HFA) 108 (90 Base) MCG/ACT inhaler Inhale 2 puffs into the lungs every 6 (six) hours as needed for wheezing or shortness of breath. 07/01/18   Biagio Borg, MD  ALPRAZolam Duanne Moron) 0.25 MG tablet Take 1 tablet (0.25 mg total) by mouth 2 (two) times daily as needed. for anxiety 09/01/20   Biagio Borg, MD  Ascorbic Acid (VITAMIN C) 1000 MG tablet  Take 1,000 mg by mouth daily.    [provider]  Biotin 5000 MCG CAPS Take 1 capsule by mouth every morning.    [provider]  iron polysaccharides (NU-IRON) 150 MG capsule Take 1 capsule (150 mg total) by mouth daily. 06/24/19   Biagio Borg, MD  MAGNESIUM PO Take by mouth daily.    [provider]  pantoprazole (PROTONIX) 40 MG tablet Take 1 tablet (40 mg total) by mouth 2 (two) times daily. 09/01/20   Biagio Borg, MD  Probiotic Product (PROBIOTIC DAILY) CAPS Take 1 capsule by mouth daily.    [provider]  promethazine-dextromethorphan (PROMETHAZINE-DM) 6.25-15 MG/5ML syrup Take 5 mLs by mouth 4 (four) times daily as needed for cough. 11/02/20   Shelda Pal, DO  tranexamic acid (LYSTEDA) 650 MG TABS tablet tranexamic acid 650 mg tablet    [provider]  traZODone (DESYREL) 100 MG tablet Take 1 tablet (100 mg total) by mouth at bedtime. 04/19/22   Biagio Borg, MD   triamcinolone cream (KENALOG) 0.1 % Apply 1 Application topically 2 (two) times daily. 05/13/22   Gildardo Pounds, NP  Vitamin D, Ergocalciferol, (DRISDOL) 1.25 MG (50000 UNIT) CAPS capsule Take 1 capsule (50,000 Units total) by mouth every 7 (seven) days. 06/25/19   Biagio Borg, MD    Family History Family History  Problem Relation Age of Onset   Cancer Sister    Colon cancer Sister 14   Rectal cancer Sister    Cancer Maternal Aunt    Breast cancer Maternal Grandmother 25       died 41   Colon polyps Neg Hx    Esophageal cancer Neg Hx    Stomach cancer Neg Hx     Social History Social History   Tobacco Use   Smoking status: Never   Smokeless tobacco: Never  Vaping Use   Vaping Use: Never used  Substance Use Topics   Alcohol use: No   Drug use: No     Allergies   Latex, Aspirin, Lactose intolerance (gi), and Moxifloxacin   Review of Systems Review of Systems  As per HPI  Physical Exam Triage Vital Signs ED Triage Vitals  Enc Vitals Group     BP 05/18/22 2013 (!) 153/98     Pulse Rate 05/18/22 2013 86     Resp 05/18/22 2013 12     Temp 05/18/22 2013 98 F (36.7 C)     Temp Source 05/18/22 2013 Oral     SpO2 05/18/22 2013 97 %     Weight --      Height --      Head Circumference --      Peak Flow --      Pain Score 05/18/22 2011 10     Pain Loc --      Pain Edu? --      Excl. in [place]? --    No data found.  Updated Vital Signs BP (!) 153/98 (BP Location: Left Arm)   Pulse 86   Temp 98 F (36.7 C) (Oral)   Resp 12   LMP 05/24/2018   SpO2 97%    Physical Exam Vitals and nursing note reviewed.  Constitutional:      General: She is not in acute distress.    Appearance: Normal appearance.  HENT:     Mouth/Throat:     Mouth: Mucous membranes are moist.     Pharynx: Oropharynx is clear. No posterior oropharyngeal  erythema.  Eyes:     Conjunctiva/sclera: Conjunctivae normal.     Pupils: Pupils are equal, round, and reactive to light.   Cardiovascular:     Rate and Rhythm: Normal rate and regular rhythm.     Pulses: Normal pulses.     Heart sounds: Normal heart sounds.  Pulmonary:     Effort: Pulmonary effort is normal. No respiratory distress.     Breath sounds: Normal breath sounds. No wheezing.  Abdominal:     General: Bowel sounds are normal.     Tenderness: There is no abdominal tenderness.  Musculoskeletal:        General: Normal range of motion.     Cervical back: Normal range of motion.  Skin:    Findings: Rash present.     Comments: Vesicular-like rash with some erythema on the left arm, medial and lateral. A few spots on the left pectoral region. 2-3 lesions on the left shoulder blade. No signs of overlying bacterial infection   Neurological:     Mental Status: She is alert and oriented to person, place, and time.     UC Treatments / Results  Labs (all labs ordered are listed, but only abnormal results are displayed) Labs Reviewed - No data to display  EKG  Radiology No results found.  Procedures Procedures   Medications Ordered in UC Medications - No data to display  Initial Impression / Assessment and Plan / UC Course  I have reviewed the triage vital signs and the nursing notes.  Pertinent labs & imaging results that were available during my care of the patient were reviewed by me and considered in my medical decision making (see chart for details).  Shingles appearing rash but crosses multiple dermatomes Consider contact dermatitis, similar to a poison ivy? Will try prednisone taper for itch and inflammation Gabapentin twice daily. Recommend tylenol as well for pain. Considered valtrex although with 1 week duration of rash, may not have any benefit at this time. Recommend to follow with PCP in person if rash persists or spreads over the next several days. Patient agrees to plan  Final Clinical Impressions(s) / UC Diagnoses   Final diagnoses:  Contact dermatitis, unspecified contact  dermatitis type, unspecified trigger  Rash and nonspecific skin eruption     Discharge Instructions      I recommend take the gabapentin twice daily for the next week or so. This should help with pain from the rash.  Take the prednisone taper as prescribed on the packaging. It will decrease by 1 pill each day.  Please follow up with primary care provider if symptoms persist.     ED Prescriptions     Medication Sig Dispense Auth. Provider   gabapentin (NEURONTIN) 300 MG capsule Take 1 capsule (300 mg total) by mouth 2 (two) times daily. 30 capsule Evette Diclemente, PA-C   predniSONE (STERAPRED UNI-PAK 21 TAB) 10 MG (21) TBPK tablet Take by mouth daily. Take as prescribed on packaging 21 tablet Beadie Matsunaga, Wells Guiles, PA-C      PDMP not reviewed this encounter.   Coleton Woon, Wells Guiles, PA-C 05/19/22 1026

## 2022-05-18 NOTE — ED Triage Notes (Signed)
Pt is here for a rash on left arm that has spread to back and chest , neck pain x 1wk

## 2023-01-03 ENCOUNTER — Encounter (INDEPENDENT_AMBULATORY_CARE_PROVIDER_SITE_OTHER): Payer: Self-pay

## 2023-01-04 ENCOUNTER — Other Ambulatory Visit (HOSPITAL_COMMUNITY): Payer: Self-pay

## 2023-01-15 ENCOUNTER — Other Ambulatory Visit (HOSPITAL_COMMUNITY): Payer: Self-pay

## 2023-02-07 ENCOUNTER — Ambulatory Visit: Payer: 59 | Admitting: Internal Medicine

## 2023-02-07 ENCOUNTER — Encounter: Payer: 59 | Admitting: Internal Medicine

## 2023-03-07 ENCOUNTER — Ambulatory Visit: Payer: 59 | Admitting: Internal Medicine

## 2023-03-07 ENCOUNTER — Other Ambulatory Visit (HOSPITAL_COMMUNITY): Payer: Self-pay

## 2023-03-07 ENCOUNTER — Ambulatory Visit
Admission: RE | Admit: 2023-03-07 | Discharge: 2023-03-07 | Disposition: A | Discharge status: Home / Self Care | Payer: BC Managed Care – PPO | Source: Ambulatory Visit | Attending: Internal Medicine | Admitting: Internal Medicine

## 2023-03-07 ENCOUNTER — Encounter: Payer: Self-pay | Admitting: Internal Medicine

## 2023-03-07 ENCOUNTER — Ambulatory Visit: Payer: BC Managed Care – PPO | Admitting: Internal Medicine

## 2023-03-07 VITALS — BP 122/76 | HR 82 | Temp 98.2°F | Ht 64.0 in | Wt 110.0 lb

## 2023-03-07 DIAGNOSIS — D509 Iron deficiency anemia, unspecified: Secondary | ICD-10-CM

## 2023-03-07 DIAGNOSIS — L918 Other hypertrophic disorders of the skin: Secondary | ICD-10-CM

## 2023-03-07 DIAGNOSIS — Z1231 Encounter for screening mammogram for malignant neoplasm of breast: Secondary | ICD-10-CM

## 2023-03-07 DIAGNOSIS — E785 Hyperlipidemia, unspecified: Secondary | ICD-10-CM

## 2023-03-07 DIAGNOSIS — Z0001 Encounter for general adult medical examination with abnormal findings: Secondary | ICD-10-CM | POA: Diagnosis not present

## 2023-03-07 DIAGNOSIS — R739 Hyperglycemia, unspecified: Secondary | ICD-10-CM | POA: Diagnosis not present

## 2023-03-07 DIAGNOSIS — E559 Vitamin D deficiency, unspecified: Secondary | ICD-10-CM

## 2023-03-07 DIAGNOSIS — Z23 Encounter for immunization: Secondary | ICD-10-CM | POA: Diagnosis not present

## 2023-03-07 DIAGNOSIS — F5101 Primary insomnia: Secondary | ICD-10-CM

## 2023-03-07 DIAGNOSIS — E538 Deficiency of other specified B group vitamins: Secondary | ICD-10-CM

## 2023-03-07 DIAGNOSIS — R059 Cough, unspecified: Secondary | ICD-10-CM

## 2023-03-07 LAB — URINALYSIS, ROUTINE W REFLEX MICROSCOPIC
Bilirubin Urine: NEGATIVE
Ketones, ur: NEGATIVE
Nitrite: NEGATIVE
Specific Gravity, Urine: 1.025 (ref 1.000–1.030)
Total Protein, Urine: NEGATIVE
Urine Glucose: NEGATIVE
Urobilinogen, UA: 0.2 (ref 0.0–1.0)
pH: 6 (ref 5.0–8.0)

## 2023-03-07 LAB — IBC PANEL
Iron: 84 ug/dL (ref 42–145)
Saturation Ratios: 23 % (ref 20.0–50.0)
TIBC: 365.4 ug/dL (ref 250.0–450.0)
Transferrin: 261 mg/dL (ref 212.0–360.0)

## 2023-03-07 LAB — CBC WITH DIFFERENTIAL/PLATELET
Basophils Absolute: 0 10*3/uL (ref 0.0–0.1)
Basophils Relative: 0.6 % (ref 0.0–3.0)
Eosinophils Absolute: 0.1 10*3/uL (ref 0.0–0.7)
Eosinophils Relative: 1.8 % (ref 0.0–5.0)
HCT: 39.5 % (ref 36.0–46.0)
Hemoglobin: 13 g/dL (ref 12.0–15.0)
Lymphocytes Relative: 57.6 % — ABNORMAL HIGH (ref 12.0–46.0)
Lymphs Abs: 2.6 10*3/uL (ref 0.7–4.0)
MCHC: 32.8 g/dL (ref 30.0–36.0)
MCV: 88.3 fL (ref 78.0–100.0)
Monocytes Absolute: 0.3 10*3/uL (ref 0.1–1.0)
Monocytes Relative: 6.1 % (ref 3.0–12.0)
Neutro Abs: 1.5 10*3/uL (ref 1.4–7.7)
Neutrophils Relative %: 33.9 % — ABNORMAL LOW (ref 43.0–77.0)
Platelets: 330 10*3/uL (ref 150.0–400.0)
RBC: 4.48 Mil/uL (ref 3.87–5.11)
RDW: 13 % (ref 11.5–15.5)
WBC: 4.5 10*3/uL (ref 4.0–10.5)

## 2023-03-07 LAB — HEPATIC FUNCTION PANEL
ALT: 26 U/L (ref 0–35)
AST: 24 U/L (ref 0–37)
Albumin: 4.7 g/dL (ref 3.5–5.2)
Alkaline Phosphatase: 89 U/L (ref 39–117)
Bilirubin, Direct: 0.3 mg/dL (ref 0.0–0.3)
Total Bilirubin: 1.7 mg/dL — ABNORMAL HIGH (ref 0.2–1.2)
Total Protein: 8 g/dL (ref 6.0–8.3)

## 2023-03-07 LAB — FERRITIN: Ferritin: 38.2 ng/mL (ref 10.0–291.0)

## 2023-03-07 LAB — LIPID PANEL
Cholesterol: 231 mg/dL — ABNORMAL HIGH (ref 0–200)
HDL: 80.3 mg/dL (ref >39.00)
LDL Cholesterol: 134 mg/dL — ABNORMAL HIGH (ref 0–99)
NonHDL: 150.25
Total CHOL/HDL Ratio: 3
Triglycerides: 80 mg/dL (ref 0.0–149.0)
VLDL: 16 mg/dL (ref 0.0–40.0)

## 2023-03-07 LAB — VITAMIN B12: Vitamin B-12: 665 pg/mL (ref 211–911)

## 2023-03-07 LAB — BASIC METABOLIC PANEL
BUN: 16 mg/dL (ref 6–23)
CO2: 30 meq/L (ref 19–32)
Calcium: 10.3 mg/dL (ref 8.4–10.5)
Chloride: 102 meq/L (ref 96–112)
Creatinine, Ser: 0.72 mg/dL (ref 0.40–1.20)
GFR: 94.89 mL/min (ref >60.00)
Glucose, Bld: 93 mg/dL (ref 70–99)
Potassium: 4 meq/L (ref 3.5–5.1)
Sodium: 138 meq/L (ref 135–145)

## 2023-03-07 LAB — TSH: TSH: 1.34 u[IU]/mL (ref 0.35–5.50)

## 2023-03-07 LAB — HEMOGLOBIN A1C: Hgb A1c MFr Bld: 5.6 % (ref 4.6–6.5)

## 2023-03-07 LAB — VITAMIN D 25 HYDROXY (VIT D DEFICIENCY, FRACTURES): VITD: 20.04 ng/mL — ABNORMAL LOW (ref 30.00–100.00)

## 2023-03-07 MED ORDER — ALBUTEROL SULFATE HFA 108 (90 BASE) MCG/ACT IN AERS
2.0000 | INHALATION_SPRAY | Freq: Four times a day (QID) | RESPIRATORY_TRACT | 11 refills | Status: AC | PRN
  Filled 2023-03-07: qty 6.7, 25d supply, fill #0

## 2023-03-07 NOTE — Progress Notes (Signed)
Patient ID: Ashley Christensen, female   DOB: 02-16-1971, 52 y.o.   MRN: [id number]         Chief Complaint:: wellness exam and Annual Exam (Referral to dermatology )  , hld, low vit d, insomnia       HPI:  Ashley Christensen is a 52 y.o. female here for wellness exam; due for shingrix today mammogram, plans to call GYN soon, o/w up to date                        Also has difficutly getting to sleep most night for several months, does not want rx meds.  Has several skin tags worsening in the past 6 mo.  Trying to follow lower chol diet,   Pt denies chest pain, increased sob or doe, wheezing, orthopnea, PND, increased LE swelling, palpitations, dizziness or syncope.   Pt denies polydipsia, polyuria, or new focal neuro s/s.    Pt denies fever, wt loss, night sweats, loss of appetite, or other constitutional symptoms    Wt Readings from Last 3 Encounters:  03/07/23 110 lb (49.9 kg)  12/21/21 102 lb 6.4 oz (46.4 kg)  11/16/20 107 lb (48.5 kg)   BP Readings from Last 3 Encounters:  03/07/23 122/76  05/18/22 (!) 153/98  12/21/21 124/80   Immunization History  Administered Date(s) Administered   Influenza Split 02/29/2020   Influenza Whole 02/18/2009   Influenza,inj,Quad PF,6+ Mos 02/13/2018   Influenza-Unspecified 02/18/2013, 02/19/2015, 01/22/2017, 02/19/2019   PFIZER(Purple Top)SARS-COV-2 Vaccination 06/09/2019, 06/30/2019   Tdap 04/01/2014   Zoster Recombinant(Shingrix) 03/07/2023   Health Maintenance Due  Topic Date Due   Cervical Cancer Screening (HPV/Pap Cotest)  07/27/2018   MAMMOGRAM  07/28/2021      Past Medical History:  Diagnosis Date   Allergy    ANEMIA-IRON DEFICIENCY    Anxiety    xanax prn   Asthma    DEPRESSION    hx of   Family history of colon cancer    rectal cancer in sister age 47   GERD (gastroesophageal reflux disease)    Past Surgical History:  Procedure Laterality Date   BREAST BIOPSY Left 01/28/2017   fibroadenoma   BREAST CYST EXCISION     pt  doesnt remember   COLONOSCOPY     colonscopy     left breast     lumpectomy left breast - cyst   SVD     x 2   TUBAL LIGATION      reports that she has never smoked. She has never used smokeless tobacco. She reports that she does not drink alcohol and does not use drugs. family history includes Breast cancer (age of onset: 21) in her maternal grandmother; Cancer in her maternal aunt and sister; Colon cancer (age of onset: 23) in her sister; Rectal cancer in her sister. Allergies  Allergen Reactions   Latex Hives and Itching   Aspirin Hives    Pt avoids ibuprofen , usually takes tylenol   Lactose Intolerance (Gi)    Moxifloxacin Nausea And Vomiting   Current Outpatient Medications on File Prior to Visit  Medication Sig Dispense Refill   ALPRAZolam (XANAX) 0.25 MG tablet Take 1 tablet (0.25 mg total) by mouth 2 (two) times daily as needed. for anxiety 60 tablet 2   Ascorbic Acid (VITAMIN C) 1000 MG tablet Take 1,000 mg by mouth daily.     Biotin 5000 MCG CAPS Take 1 capsule by mouth every morning.  iron polysaccharides (NU-IRON) 150 MG capsule Take 1 capsule (150 mg total) by mouth daily. 90 capsule 3   MAGNESIUM PO Take by mouth daily.     pantoprazole (PROTONIX) 40 MG tablet Take 1 tablet (40 mg total) by mouth 2 (two) times daily. 180 tablet 3   Probiotic Product (PROBIOTIC DAILY) CAPS Take 1 capsule by mouth daily.     promethazine-dextromethorphan (PROMETHAZINE-DM) 6.25-15 MG/5ML syrup Take 5 mLs by mouth 4 (four) times daily as needed for cough. 118 mL 0   tranexamic acid (LYSTEDA) 650 MG TABS tablet tranexamic acid 650 mg tablet     traZODone (DESYREL) 100 MG tablet Take 1 tablet (100 mg total) by mouth at bedtime. 90 tablet 1   triamcinolone cream (KENALOG) 0.1 % Apply 1 Application topically 2 (two) times daily. 60 g 0   Vitamin D, Ergocalciferol, (DRISDOL) 1.25 MG (50000 UNIT) CAPS capsule Take 1 capsule (50,000 Units total) by mouth every 7 (seven) days. 12 capsule 0    No current facility-administered medications on file prior to visit.        ROS:  All others reviewed and negative.  Objective        PE:  BP 122/76 (BP Location: Left Arm, Patient Position: Sitting, Cuff Size: Normal)   Pulse 82   Temp 98.2 F (36.8 C) (Oral)   Ht 5\' 4"  (1.626 m)   Wt 110 lb (49.9 kg)   LMP 05/24/2018   SpO2 98%   BMI 18.88 kg/m                 Constitutional: Pt appears in NAD               HENT: Head: NCAT.                Right Ear: External ear normal.                 Left Ear: External ear normal.                Eyes: . Pupils are equal, round, and reactive to light. Conjunctivae and EOM are normal               Nose: without d/c or deformity               Neck: Neck supple. Gross normal ROM               Cardiovascular: Normal rate and regular rhythm.                 Pulmonary/Chest: Effort normal and breath sounds without rales or wheezing.                Abd:  Soft, NT, ND, + BS, no organomegaly               Neurological: Pt is alert. At baseline orientation, motor grossly intact               Skin: Skin is warm. No rashes, has several skin tags LE edema - none               Psychiatric: Pt behavior is normal without agitation   Micro: none  Cardiac tracings I have personally interpreted today:  none  Pertinent Radiological findings (summarize): none   Lab Results  Component Value Date   WBC 4.5 03/07/2023   HGB 13.0 03/07/2023   HCT 39.5 03/07/2023   PLT 330.0 03/07/2023   GLUCOSE 93  03/07/2023   CHOL 231 (H) 03/07/2023   TRIG 80.0 03/07/2023   HDL 80.30 03/07/2023   LDLCALC 134 (H) 03/07/2023   ALT 26 03/07/2023   AST 24 03/07/2023   NA 138 03/07/2023   K 4.0 03/07/2023   CL 102 03/07/2023   CREATININE 0.72 03/07/2023   BUN 16 03/07/2023   CO2 30 03/07/2023   TSH 1.34 03/07/2023   INR 1.1 ratio (H) 03/29/2010   HGBA1C 5.6 03/07/2023   Assessment/Plan:  Ashley Christensen is a 52 y.o. Black or African American [2] female with   has a past medical history of Allergy, ANEMIA-IRON DEFICIENCY, Anxiety, Asthma, DEPRESSION, Family history of colon cancer, and GERD (gastroesophageal reflux disease).  Encounter for well adult exam with abnormal findings Age and sex appropriate education and counseling updated with regular exercise and diet Referrals for preventative services - none needed Immunizations addressed - for shingrix #1 today  Smoking counseling  - none needed Evidence for depression or other mood disorder - none significant Most recent labs reviewed. I have personally reviewed and have noted: 1) the patient's medical and social history 2) The patient's current medications and supplements 3) The patient's height, weight, and BMI have been recorded in the chart   Vitamin D deficiency Last vitamin D Lab Results  Component Value Date   VD25OH 20.04 (L) 03/07/2023   Low, to start oral replacement   Skin tags, multiple acquired Also for derm referral  Iron deficiency anemia No overt bleeding, for f/u iron lab  Insomnia Mild to mod, for otc melatonin 10 mg at bedtime prn,  to f/u any worsening symptoms or concerns  HLD (hyperlipidemia) Lab Results  Component Value Date   LDLCALC 134 (H) 03/07/2023   Uncontrolled, for lower chol diet, to start crestor 20 qd  Followup: Return in about 1 year (around 03/06/2024).  Oliver Barre, MD 03/10/2023 7:34 PM [facility]

## 2023-03-07 NOTE — Patient Instructions (Addendum)
You had the Shingles shot #1 today  Please return in 2 months for Nurse Visit for Shingles shot #2  You will be contacted regarding the referral for: mammogram  Please remember to see your GYN for yearly exam  You can take up to 10 mg melatonin at bedtime for sleep  Please take OTC Vitamin D3 at 2000 units per day, indefinitely,  Please continue all other medications as before, and refills have been done if requested.  Please have the pharmacy call with any other refills you may need.  Please continue your efforts at being more active, low cholesterol diet, and weight control.  You are otherwise up to date with prevention measures today.  Please keep your appointments with your specialists as you may have planned  .You will be contacted regarding the referral for:  dermatology  Please go to the LAB at the blood drawing area for the tests to be done  You will be contacted by phone if any changes need to be made immediately.  Otherwise, you will receive a letter about your results with an explanation, but please check with MyChart first.  .Please make an Appointment to return for your 1 year visit, or sooner if needed

## 2023-03-08 ENCOUNTER — Encounter: Payer: Self-pay | Admitting: Internal Medicine

## 2023-03-08 ENCOUNTER — Other Ambulatory Visit (HOSPITAL_COMMUNITY): Payer: Self-pay

## 2023-03-08 MED ORDER — ROSUVASTATIN CALCIUM 20 MG PO TABS
20.0000 mg | ORAL_TABLET | Freq: Every day | ORAL | 3 refills | Status: AC
  Filled 2023-03-08: qty 90, 90d supply, fill #0

## 2023-03-08 MED ORDER — CEPHALEXIN 500 MG PO CAPS
500.0000 mg | ORAL_CAPSULE | Freq: Three times a day (TID) | ORAL | 0 refills | Status: AC
  Filled 2023-03-08: qty 30, 10d supply, fill #0

## 2023-03-10 ENCOUNTER — Encounter: Payer: Self-pay | Admitting: Internal Medicine

## 2023-03-10 NOTE — Assessment & Plan Note (Signed)
Lab Results  Component Value Date   LDLCALC 134 (H) 03/07/2023   Uncontrolled, for lower chol diet, to start crestor 20 qd

## 2023-03-10 NOTE — Assessment & Plan Note (Signed)
No overt bleeding, for f/u iron lab

## 2023-03-10 NOTE — Assessment & Plan Note (Signed)
Mild to mod, for otc melatonin 10 mg at bedtime prn,  to f/u any worsening symptoms or concerns

## 2023-03-10 NOTE — Assessment & Plan Note (Signed)
Last vitamin D Lab Results  Component Value Date   VD25OH 20.04 (L) 03/07/2023   Low, to start oral replacement

## 2023-03-10 NOTE — Assessment & Plan Note (Signed)
Also for derm referral 

## 2023-03-10 NOTE — Assessment & Plan Note (Signed)
Age and sex appropriate education and counseling updated with regular exercise and diet ?Referrals for preventative services - none needed ?Immunizations addressed - for shingrix #1 today ?Smoking counseling  - none needed ?Evidence for depression or other mood disorder - none significant ?Most recent labs reviewed. ?I have personally reviewed and have noted: ?1) the patient's medical and social history ?2) The patient's current medications and supplements ?3) The patient's height, weight, and BMI have been recorded in the chart ? ?

## 2023-04-24 ENCOUNTER — Encounter: Payer: Self-pay | Admitting: Internal Medicine

## 2023-04-24 ENCOUNTER — Other Ambulatory Visit: Payer: Self-pay | Admitting: Internal Medicine

## 2023-07-19 ENCOUNTER — Emergency Department (HOSPITAL_BASED_OUTPATIENT_CLINIC_OR_DEPARTMENT_OTHER)
Admission: EM | Admit: 2023-07-19 | Discharge: 2023-07-19 | Disposition: A | Discharge status: Home / Self Care | Payer: 59 | Attending: Emergency Medicine | Admitting: Emergency Medicine

## 2023-07-19 ENCOUNTER — Other Ambulatory Visit (HOSPITAL_COMMUNITY): Payer: Self-pay

## 2023-07-19 ENCOUNTER — Encounter (HOSPITAL_BASED_OUTPATIENT_CLINIC_OR_DEPARTMENT_OTHER): Payer: Self-pay | Admitting: *Deleted

## 2023-07-19 ENCOUNTER — Emergency Department (HOSPITAL_BASED_OUTPATIENT_CLINIC_OR_DEPARTMENT_OTHER): Payer: 59

## 2023-07-19 ENCOUNTER — Other Ambulatory Visit: Payer: Self-pay

## 2023-07-19 ENCOUNTER — Emergency Department (HOSPITAL_BASED_OUTPATIENT_CLINIC_OR_DEPARTMENT_OTHER): Payer: 59 | Admitting: Radiology

## 2023-07-19 DIAGNOSIS — Y92410 Unspecified street and highway as the place of occurrence of the external cause: Secondary | ICD-10-CM | POA: Diagnosis not present

## 2023-07-19 DIAGNOSIS — R519 Headache, unspecified: Secondary | ICD-10-CM | POA: Insufficient documentation

## 2023-07-19 DIAGNOSIS — M542 Cervicalgia: Secondary | ICD-10-CM | POA: Insufficient documentation

## 2023-07-19 DIAGNOSIS — Z91040 Latex allergy status: Secondary | ICD-10-CM | POA: Insufficient documentation

## 2023-07-19 MED ORDER — ACETAMINOPHEN 325 MG PO TABS
650.0000 mg | ORAL_TABLET | Freq: Once | ORAL | Status: AC
  Administered 2023-07-19: 650 mg via ORAL
  Filled 2023-07-19: qty 2

## 2023-07-19 MED ORDER — METHOCARBAMOL 500 MG PO TABS
500.0000 mg | ORAL_TABLET | Freq: Two times a day (BID) | ORAL | 0 refills | Status: AC
  Filled 2023-07-19: qty 20, 10d supply, fill #0
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
## 2023-07-25 ENCOUNTER — Other Ambulatory Visit (HOSPITAL_COMMUNITY): Payer: Self-pay

## 2023-07-25 ENCOUNTER — Encounter: Payer: Self-pay | Admitting: Internal Medicine

## 2023-07-25 ENCOUNTER — Ambulatory Visit (INDEPENDENT_AMBULATORY_CARE_PROVIDER_SITE_OTHER): Payer: 59 | Admitting: Internal Medicine

## 2023-07-25 VITALS — BP 120/76 | HR 75 | Temp 98.4°F | Ht 64.0 in | Wt 115.0 lb

## 2023-07-25 DIAGNOSIS — M5412 Radiculopathy, cervical region: Secondary | ICD-10-CM

## 2023-07-25 DIAGNOSIS — E559 Vitamin D deficiency, unspecified: Secondary | ICD-10-CM | POA: Diagnosis not present

## 2023-07-25 DIAGNOSIS — F411 Generalized anxiety disorder: Secondary | ICD-10-CM

## 2023-07-25 DIAGNOSIS — K219 Gastro-esophageal reflux disease without esophagitis: Secondary | ICD-10-CM

## 2023-07-25 MED ORDER — PREDNISONE 10 MG PO TABS
ORAL_TABLET | ORAL | 0 refills | Status: AC
  Filled 2023-07-25: qty 18, 9d supply, fill #0

## 2023-07-25 NOTE — Patient Instructions (Signed)
 You appear to have a mild right cervical radiculitis and neck strain  Please take all new medication as prescribed - the prednisone  Please continue all other medications as before, and refills have been done if requested.  Please have the pharmacy call with any other refills you may need.  Please keep your appointments with your specialists as you may have planned  Your work form is filled out today  You are given the work note from our office as well
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
## 2023-07-28 ENCOUNTER — Encounter: Payer: Self-pay | Admitting: Internal Medicine

## 2023-07-28 DIAGNOSIS — M5412 Radiculopathy, cervical region: Secondary | ICD-10-CM | POA: Insufficient documentation

## 2023-07-28 NOTE — Assessment & Plan Note (Signed)
Stable, cont protonix 40 mg

## 2023-07-28 NOTE — Assessment & Plan Note (Signed)
Last vitamin D Lab Results  Component Value Date   VD25OH 20.04 (L) 03/07/2023   Low, to start oral replacement
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
## 2023-07-28 NOTE — Assessment & Plan Note (Signed)
 Overall stable, continue current med tx, declines need for counseling

## 2023-07-30 ENCOUNTER — Other Ambulatory Visit (HOSPITAL_COMMUNITY): Payer: Self-pay

## 2023-08-06 ENCOUNTER — Other Ambulatory Visit (HOSPITAL_COMMUNITY): Payer: Self-pay

## 2023-09-09 ENCOUNTER — Ambulatory Visit: Payer: BC Managed Care – PPO | Admitting: Dermatology

## 2023-11-15 ENCOUNTER — Encounter: Payer: Self-pay | Admitting: Internal Medicine

## 2023-11-26 ENCOUNTER — Telehealth: Payer: Self-pay | Admitting: Internal Medicine

## 2023-11-26 NOTE — Telephone Encounter (Signed)
 Copied from CRM [id number]. Topic: MyChart - Other >> Nov 26, 2023  1:33 PM Precious C wrote: Reason for CRM: Patient called regarding paperwork her provider completed. She was informed it is available for pick-up, but stated she is unable to retrieve it due to her work schedule and the office being closed by the time she gets off. She is requesting if the completed forms can be sent to her via MyChart instead. I informed her I would send a note to the provider's office for further assistance.

## 2023-11-29 ENCOUNTER — Telehealth: Payer: Self-pay

## 2023-11-29 NOTE — Telephone Encounter (Signed)
 Copied from CRM [id number]. Topic: General - Other >> Nov 29, 2023  2:23 PM [name] wrote: Reason for CRM: Patient returning call to Zach, agent advised patient of message as stated patient acknowledged, doesn't have a fax number uploading to MyChart is great.

## 2023-11-29 NOTE — Telephone Encounter (Signed)
 Noted

## 2023-11-29 NOTE — Telephone Encounter (Signed)
 Called and left voice message for patient to call back. Would like to inform patient that we are working on getting this uploaded to her chart at the moment. Also wanted to inquire with patient on if she had a fax number she would like us  to make the forms out to as well

## 2023-12-02 NOTE — Telephone Encounter (Signed)
 Copied from CRM [id number]. Topic: General - Other >> Dec 02, 2023  2:53 PM [name] wrote: Patient following up to see if paperwork was uploaded to MyChart, states Darlyn was supposed to upload documents  Debroah [phone]

## 2023-12-16 ENCOUNTER — Ambulatory Visit: Admitting: Dermatology

## 2024-01-22 ENCOUNTER — Ambulatory Visit: Admitting: Dermatology

## 2024-11-25 ENCOUNTER — Encounter: Admitting: Family Medicine
